# Patient Record
Sex: Female | Born: 1965 | Race: Black or African American | Hispanic: No | Marital: Single | Smoking: Never smoker
Health system: Southern US, Community
[De-identification: ages and names within clinical notes are randomized; demographics above are authoritative.]

## PROBLEM LIST (undated history)

## (undated) DIAGNOSIS — M549 Dorsalgia, unspecified: Secondary | ICD-10-CM

## (undated) DIAGNOSIS — I872 Venous insufficiency (chronic) (peripheral): Secondary | ICD-10-CM

## (undated) DIAGNOSIS — K219 Gastro-esophageal reflux disease without esophagitis: Secondary | ICD-10-CM

## (undated) DIAGNOSIS — M1990 Unspecified osteoarthritis, unspecified site: Secondary | ICD-10-CM

## (undated) DIAGNOSIS — I10 Essential (primary) hypertension: Secondary | ICD-10-CM

## (undated) HISTORY — PX: APPENDECTOMY: SHX54

## (undated) HISTORY — DX: Essential (primary) hypertension: I10

## (undated) HISTORY — PX: ABDOMINAL HYSTERECTOMY: SHX81

---

## 2014-08-30 ENCOUNTER — Encounter (HOSPITAL_COMMUNITY): Payer: Self-pay | Admitting: *Deleted

## 2014-08-30 ENCOUNTER — Emergency Department (HOSPITAL_COMMUNITY): Payer: Self-pay

## 2014-08-30 ENCOUNTER — Emergency Department (HOSPITAL_COMMUNITY)
Admission: EM | Admit: 2014-08-30 | Discharge: 2014-08-30 | Disposition: A | Discharge status: Home / Self Care | Payer: Self-pay | Attending: Emergency Medicine | Admitting: Emergency Medicine

## 2014-08-30 DIAGNOSIS — R079 Chest pain, unspecified: Secondary | ICD-10-CM | POA: Insufficient documentation

## 2014-08-30 DIAGNOSIS — R059 Cough, unspecified: Secondary | ICD-10-CM

## 2014-08-30 DIAGNOSIS — R05 Cough: Secondary | ICD-10-CM | POA: Insufficient documentation

## 2014-08-30 DIAGNOSIS — M1990 Unspecified osteoarthritis, unspecified site: Secondary | ICD-10-CM | POA: Insufficient documentation

## 2014-08-30 DIAGNOSIS — Z8679 Personal history of other diseases of the circulatory system: Secondary | ICD-10-CM | POA: Insufficient documentation

## 2014-08-30 DIAGNOSIS — R0602 Shortness of breath: Secondary | ICD-10-CM | POA: Insufficient documentation

## 2014-08-30 HISTORY — DX: Unspecified osteoarthritis, unspecified site: M19.90

## 2014-08-30 HISTORY — DX: Venous insufficiency (chronic) (peripheral): I87.2

## 2014-08-30 LAB — CBC
HCT: 39.6 % (ref 36.0–46.0)
Hemoglobin: 12.9 g/dL (ref 12.0–15.0)
MCH: 29.5 pg (ref 26.0–34.0)
MCHC: 32.6 g/dL (ref 30.0–36.0)
MCV: 90.6 fL (ref 78.0–100.0)
PLATELETS: 287 10*3/uL (ref 150–400)
RBC: 4.37 MIL/uL (ref 3.87–5.11)
RDW: 14.1 % (ref 11.5–15.5)
WBC: 8.6 10*3/uL (ref 4.0–10.5)

## 2014-08-30 LAB — BASIC METABOLIC PANEL
Anion gap: 8 (ref 5–15)
BUN: 11 mg/dL (ref 6–20)
CO2: 25 mmol/L (ref 22–32)
CREATININE: 0.84 mg/dL (ref 0.44–1.00)
Calcium: 8.6 mg/dL — ABNORMAL LOW (ref 8.9–10.3)
Chloride: 108 mmol/L (ref 101–111)
GFR calc Af Amer: >60 mL/min (ref >60)
Glucose, Bld: 100 mg/dL — ABNORMAL HIGH (ref 65–99)
Potassium: 4.1 mmol/L (ref 3.5–5.1)
Sodium: 141 mmol/L (ref 135–145)

## 2014-08-30 LAB — I-STAT TROPONIN, ED
TROPONIN I, POC: 0 ng/mL (ref 0.00–0.08)
Troponin i, poc: 0 ng/mL (ref 0.00–0.08)

## 2014-08-30 MED ORDER — ALBUTEROL SULFATE (2.5 MG/3ML) 0.083% IN NEBU
2.5000 mg | INHALATION_SOLUTION | Freq: Once | RESPIRATORY_TRACT | Status: AC
  Administered 2014-08-30: 2.5 mg via RESPIRATORY_TRACT
  Filled 2014-08-30: qty 3

## 2014-08-30 MED ORDER — ALBUTEROL SULFATE HFA 108 (90 BASE) MCG/ACT IN AERS
2.0000 | INHALATION_SPRAY | RESPIRATORY_TRACT | Status: DC
  Administered 2014-08-30: 2 via RESPIRATORY_TRACT
  Filled 2014-08-30 (×2): qty 6.7

## 2014-08-30 MED ORDER — BENZONATATE 100 MG PO CAPS
100.0000 mg | ORAL_CAPSULE | Freq: Three times a day (TID) | ORAL | Status: DC
Start: 2014-08-30 — End: 2015-04-08

## 2014-08-30 MED ORDER — BENZONATATE 100 MG PO CAPS
200.0000 mg | ORAL_CAPSULE | Freq: Once | ORAL | Status: AC
  Administered 2014-08-30: 200 mg via ORAL
  Filled 2014-08-30: qty 2

## 2014-08-30 MED ORDER — HYDROCODONE-HOMATROPINE 5-1.5 MG/5ML PO SYRP: 5.0000 mL | ORAL_SOLUTION | Freq: Four times a day (QID) | ORAL | Status: DC | PRN

## 2014-08-30 NOTE — Discharge Instructions (Signed)
Cough, Adult  A cough is a reflex. It helps you clear your throat and airways. A cough can help heal your body. A cough can last 2 or 3 weeks (acute) or may last more than 8 weeks (chronic). Some common causes of a cough can include an infection, allergy, or a cold. HOME CARE  Only take medicine as told by your doctor.  If given, take your medicines (antibiotics) as told. Finish them even if you start to feel better.  Use a cold steam vaporizer or humidifier in your home. This can help loosen thick spit (secretions).  Sleep so you are almost sitting up (semi-upright). Use pillows to do this. This helps reduce coughing.  Rest as needed.  Stop smoking if you smoke. GET HELP RIGHT AWAY IF:  You have yellowish-white fluid (pus) in your thick spit.  Your cough gets worse.  Your medicine does not reduce coughing, and you are losing sleep.  You cough up blood.  You have trouble breathing.  Your pain gets worse and medicine does not help.  You have a fever. MAKE SURE YOU:   Understand these instructions.  Will watch your condition.  Will get help right away if you are not doing well or get worse. Document Released: 11/09/2010 Document Revised: 07/13/2013 Document Reviewed: 11/09/2010 [facility] Patient Information 2015 [place], [place]. This information is not intended to replace advice given to you by your health care provider. Make sure you discuss any questions you have with your health care provider.  Emergency Department Resource Guide 1) Find a Doctor and Pay Out of Pocket Although you won't have to find out who is covered by your insurance plan, it is a good idea to ask around and get recommendations. You will then need to call the office and see if the doctor you have chosen will accept you as a new patient and what types of options they offer for patients who are self-pay. Some doctors offer discounts or will set up payment plans for their patients who do not have insurance,  but you will need to ask so you aren't surprised when you get to your appointment.  2) Contact Your Local Health Department Not all health departments have doctors that can see patients for sick visits, but many do, so it is worth a call to see if yours does. If you don't know where your local health department is, you can check in your phone book. The CDC also has a tool to help you locate your state's health department, and many state websites also have listings of all of their local health departments.  3) Find a [facility] If your illness is not likely to be very severe or complicated, you may want to try a walk in clinic. These are popping up all over the country in pharmacies, drugstores, and shopping centers. They're usually staffed by nurse practitioners or physician assistants that have been trained to treat common illnesses and complaints. They're usually fairly quick and inexpensive. However, if you have serious medical issues or chronic medical problems, these are probably not your best option.  No Primary Care Doctor: - Call Health Connect at  [phone] - they can help you locate a primary care doctor that  accepts your insurance, provides certain services, etc. - Physician Referral Service- [phone]  Chronic Pain Problems: Organization         Address  Phone   Notes  [facility]  [phone] Patients need to be referred by  their primary care doctor.   Medication Assistance: Organization         Address  Phone   Notes  [facility] Medication [facility] [address] [place], [place] [address] [phone] --Must be a resident of [facility] -- Must have NO insurance coverage whatsoever (no Medicaid/ Medicare, etc.) -- The pt. MUST have a primary care doctor that directs their care regularly and follows them in the community   Med[facility]ist  [phone]   [employer]  [phone]    Agencies that provide inexpensive  medical care: Organization         Address  Phone   Notes  [place]  [phone]   [facility]    [phone]   [facility] [place], [place] [address] [phone]   [place] [address] [place] [phone]   Planned Parenthood    [phone]   [facility]    [phone]   [place] and [place] [address], [place] Phone:  [phone], Fax:  [phone] Hours of Operation:  9 am - 6 pm, M-F.  Also accepts Medicaid/Medicare and self-pay.  [facility] for [place] [address], [address], [place] Phone: [phone], Fax: [phone]. Hours of Operation:  8:30 am - 5:30 pm, M-F.  Also accepts Medicaid and self-pay.  [facility] [place] [address] [place] Phone: [phone]   [place], [place], [place] [phone], Ext. [phone] Mondays & Thursdays: 7-9 AM.  First 15 patients are seen on a first come, first serve basis.    [place] Providers:  Organization         Address  Phone   Notes  [facility] [address], [place] [phone] Also accepts self-pay patients.  [facility] [address], [place]  [phone]   [place], [address], [place] [phone]   [facility] [address] [place] [phone]   Lucianne Lei [address], [place]   [phone] Only accepts [place] Access [place] patients after they have their name applied to their card.   Self-Pay (no insurance) in [facility]:  Organization         Address  Phone   Notes  Sickle Cell Patients, [facility] [place] [phone]   [facility]gent Care [place] [phone]   [facility] [place]  [place], [place], [place] [phone]   [facility]/Dr. Osei-Bonsu  [address], [place] or [address], [place] [phone] Phone number for both [place] and [place] locations is the same.  Urgent Medical and [facility] [address] [place] [phone]   [facility] [address] [place] or [address] [phone]   [facility] [address] [place] [phone], phone; [phone], fax Sees patients 1st and 3rd Saturday of every month.  Must not qualify for public or private insurance (i.e. Medicaid, Medicare, [place] Health Choice, Veterans' Benefits)  Household income should be no more than  200% of the poverty level The clinic cannot treat you if you are pregnant or think you are pregnant  Sexually transmitted diseases are not treated at the clinic.    Dental Care: Organization         Address  Phone  Notes  [facility] Department of [facility] [phone] Accepts children up to age 23 who are enrolled in [place] or [place]; pregnant women with a Medicaid card; and children who have applied for Medicaid or [place] Health Choice, but were declined, whose parents can pay a reduced fee at time of service.  [facility] Department of [facility]  [address], [place] [phone] Accepts children up to age 29 who are enrolled in [place] or [place]; pregnant women with a Medicaid card; and children who have applied for Medicaid or [place] Health Choice, but were declined, whose parents can pay a reduced fee at time of service.  [place] Adult Dental Access PROGRAM  [place] [phone] Patients are seen by appointment only. Walk-ins are not accepted.  [place] will see patients 23 years of age and older. Monday - Tuesday (8am-5pm) Most Wednesdays (8:30-5pm) $30 per visit, cash only  [facility] Adult Dental Access PROGRAM  [address], [facility] [phone] Patients are seen by appointment only. Walk-ins are not accepted. [place] will see patients 98 years of age and older. One Wednesday Evening (Monthly: Volunteer Based).  $30 per visit, cash only  [place]  [phone] for adults; Children under age 71, call Graduate Pediatric Dentistry at [phone]. Children aged 2-14, please call [phone] to request a pediatric application.  Dental services are provided in all areas of dental care including fillings, crowns and bridges, complete and partial dentures, implants, gum treatment, root canals, and extractions. Preventive care is also provided. Treatment is provided to both adults and children. Patients are selected via a lottery and there is often a waiting list.   [facility] [address] [place]  [phone] [email or web address]   [facility] [address] [place], [place] [phone], Ext. [phone] Second and Fourth Thursday of each month, opens at 6:30 AM; Clinic ends at 9 AM.  Patients are seen on a first-come first-served basis, and a limited number are seen during each clinic.   [facility]  [address] Hillard Danker [place], [place] [phone]   Eligibility Requirements You must have lived in [place], [place], or [place] counties for at least the last three months.   You cannot be eligible for state or federal sponsored [employer], including [employer], [place], or [employer].   You generally cannot be eligible for healthcare insurance through your employer.    How to apply: Eligibility screenings are held every Tuesday and Wednesday afternoon from 1:00 pm until 4:00 pm. You do not need an appointment for the  interview!  [facility] [address] [place], [place]   [place]  [place] Department  [place]  [phone]    Behavioral Health Resources in the Community: Intensive Outpatient Programs Organization         Address  Phone  Notes  [place]. [address], [place], [place]   [place]  Outpatient [address] [place], [place]   ADS: Alcohol & Drug Svcs [address], [place], [place]   [place] [address],  [place], [place] or [phone]   Substance Abuse Resources Organization         Address  Phone  Notes  Alcohol and Drug Services  [phone]   [place]  [phone]   [facility]   Chinita Pester  [phone]   Residential & Outpatient Substance Abuse Program  [phone]   Psychological Services Organization         Address  Phone  Notes  [facility] [place]  [place]  [phone]   [facility] [address] [place] or [phone]    Mobile Crisis Teams Organization         Address  Phone  Notes  [facility], Mobile Crisis Care Unit  [phone]   [facility]  [address] [place], [place]   [place] [address], [place] [phone]    Self-Help/Support Groups Organization         Address  Phone             Notes  [place]. of [place] - variety of support groups  [place] Call for more information  Narcotics Anonymous (NA), [facility] [address], [employer] [place]  2 meetings at this location   Special educational needs teacher         Address  Phone  Notes  ASAP Residential Treatment  [facility],    [place]  [phone]   [facility]  [address] [place] [address], [place], [place]   [place] Stedman, [place] [phone] Admissions: 8am-3pm M-F  Incentives Substance [place] [address],    [place], [place] [phone]   [facility] [address] [place], [place], [place]   [facility] [address],  [place], [place]   [facility] Programs - Intensive Outpatient [address] Kristeen [address], [place], [place]   [facility] ([place].) [place].,  [place], [place] [phone] or [phone]   [facility]idential Treatment Services (RTS) [address], [place], [place] Accepts Medicaid  [facility] [place] [address],  [place] [phone] Substance Abuse/Addiction Treatment   [facility] Organization         Address  Phone  Notes  [facility]  [phone]   Domenic Schwab, PhD [address] Arlis [place], [place]   [phone] or [phone]   [place], [place] [phone]   [facility] [address], [place], [place] [phone] Insurance/Medicaid/sponsorship through [facility] and Families [address], [address] [place]                                    [place], [place] [phone] [place] [address], [place] [phone]    Dr. Adele Schilder  [phone]   [facility] [place] Dept. 1) [address] [place] 2) [place] 3)  [address]  [place], [place] [phone]  [phone]   [facility] Child Abuse Hotline 934-019-4671 or [phone] (After Hours)

## 2014-08-30 NOTE — ED Notes (Signed)
Pt complains of chest discomfort, wheezing, cough, shortness of breath, nausea progressing over the past several days. Pt states the discomfort and shortness of breath is present when active and resting.

## 2014-08-30 NOTE — ED Provider Notes (Signed)
CSN: [id number]     Arrival date & time 08/30/14  1711 History   First MD Initiated Contact with Patient 08/30/14 1848     Chief Complaint  Patient presents with  . Chest Pain  . Shortness of Breath     (Consider location/radiation/quality/duration/timing/severity/associated sxs/prior Treatment) Patient is a 49 y.o. female presenting with chest pain and shortness of breath. The history is provided by the patient. No language interpreter was used.  Chest Pain Associated symptoms: cough and shortness of breath   Associated symptoms: no dizziness, no fever and no weakness   Shortness of Breath Associated symptoms: chest pain and cough   Associated symptoms: no fever    Miss Peterkin is a 49 year old female with a history of arthritis and chronic venous insufficiency who reports chest pain and shortness of breath for the past 2 days. She states she has been coughing for over 2 weeks. She describes the pain as constant but worse when laying on her side. She states the pain is located in the middle of her chest. She states she took NyQuil twice yesterday with minimal relief. She has not taken aspirin and is not on any anticoagulation. She denies any history of DVT or estrogen use. She denies any fever, chills, dizziness, abdominal pain, vomiting, diarrhea, urinary symptoms, increased leg swelling. She denies any history of hypertension or diabetes. No history of COPD or asthma. She denies smoking. No regular exercise. Past Medical History  Diagnosis Date  . Arthritis   . Chronic venous insufficiency    Past Surgical History  Procedure Laterality Date  . Abdominal hysterectomy    . Appendectomy     No family history on file. History  Substance Use Topics  . Smoking status: Never Smoker   . Smokeless tobacco: Not on file  . Alcohol Use: No   OB History    No data available     Review of Systems  Constitutional: Negative for fever.  Respiratory: Positive for cough and shortness of  breath.   Cardiovascular: Positive for chest pain.  Neurological: Negative for dizziness, syncope and weakness.  All other systems reviewed and are negative.     Allergies  Review of patient's allergies indicates no known allergies.  Home Medications   Prior to Admission medications   Medication Sig Start Date End Date Taking? Authorizing Provider  Doxylamine-DM (VICKS NYQUIL COUGH) 6.25-15 MG/15ML LIQD Take 15 mLs by mouth every 8 (eight) hours as needed (cough/congestion).   Yes Historical Provider, MD  ibuprofen (ADVIL,MOTRIN) 800 MG tablet Take 800 mg by mouth every 8 (eight) hours as needed for moderate pain.   Yes Historical Provider, MD  indomethacin (INDOCIN SR) 75 MG CR capsule Take 75 mg by mouth daily with breakfast.   Yes Historical Provider, MD  benzonatate (TESSALON) 100 MG capsule Take 1 capsule (100 mg total) by mouth every 8 (eight) hours. 08/30/14   Jentry Warnell Patel-Mills, PA-C  HYDROcodone-homatropine (HYCODAN) 5-1.5 MG/5ML syrup Take 5 mLs by mouth every 6 (six) hours as needed for cough. 08/30/14   Talissa Apple Patel-Mills, PA-C   BP 145/83 mmHg  Pulse 84  Temp(Src) 98.2 F (36.8 C) (Oral)  Resp 20  SpO2 99% Physical Exam  Constitutional: She is oriented to person, place, and time. Vital signs are normal. She appears well-developed and well-nourished.  Obese.  HENT:  Head: Normocephalic and atraumatic.  Eyes: Conjunctivae are normal.  Neck: Normal range of motion. Neck supple.  Cardiovascular: Normal rate, regular rhythm and normal heart sounds.  Pulmonary/Chest: Effort normal and breath sounds normal. No respiratory distress. She has no wheezes. She has no rales. She exhibits no tenderness.  Abdominal: Soft. There is no tenderness.  Musculoskeletal: Normal range of motion.  Neurological: She is alert and oriented to person, place, and time.  Skin: Skin is warm and dry.  Nursing note and vitals reviewed.   ED Course  Procedures (including critical care time) Labs  Review Labs Reviewed  BASIC METABOLIC PANEL - Abnormal; Notable for the following:    Glucose, Bld 100 (*)    Calcium 8.6 (*)    All other components within normal limits  CBC  I-STAT TROPOININ, ED  I-STAT TROPOININ, ED    Imaging Review Dg Chest 2 View (if Patient Has Fever And/or Copd)  08/30/2014   CLINICAL DATA:  Chest discomfort. Wheezing. Cough. Shortness of breath.  EXAM: CHEST  2 VIEW  COMPARISON:  None.  FINDINGS: Lateral view degraded by patient arm position. Mild thoracic spondylosis. Midline trachea. Normal heart size and mediastinal contours. No pleural effusion or pneumothorax. Clear lungs.  IMPRESSION: No acute cardiopulmonary disease.   Electronically Signed   By: Abigail Miyamoto M.D.   On: 08/30/2014 18:36     EKG Interpretation   Date/Time:  Monday August 30 2014 17:24:10 EDT Ventricular Rate:  112 PR Interval:  145 QRS Duration: 75 QT Interval:  315 QTC Calculation: 430 R Axis:   -13 Text Interpretation:  Sinus tachycardia Atrial premature complex Low  voltage, precordial leads LVH by voltage Anterior Q waves, possibly due to  LVH No previous tracing Confirmed by POLLINA  MD, CHRISTOPHER [phone]) on  08/30/2014 8:05:39 PM      MDM   Final diagnoses:  Shortness of breath  Cough  Patient presents for chest pain and shortness of breath that is worse with exertion and when laying down. She states she has had a nonproductive cough for several weeks which is causing her chest to hurt. She is in no acute distress and 100% oxygen on room air. Her labs are unremarkable, troponin negative, EKG shows tachycardia but otherwise is not concerning. On exam she has a normal rate and rhythm. I reviewed the heart score and she is at low risk for major cardiac event. Her chest x-ray shows no pneumothorax, pneumonia, edema. I have given her a nebulizer treatment. Repeat troponin was negative also. Her heart rate has since come down. I believe this is caused by an upper respiratory  infection and not her heart or a PE. I prescribed her hycodan and tessalon for cough. I explained that hycodan may be expensive and if she could not afford it, she could have the tessalon filled.  I also gave her an inhaler. I gave her return precautions such as increased shortness of breath or fever.     Ottie Glazier, PA-C 08/31/14 [place], MD 09/06/14 [phone]

## 2015-02-20 ENCOUNTER — Encounter (HOSPITAL_COMMUNITY): Payer: Self-pay

## 2015-02-20 ENCOUNTER — Emergency Department (HOSPITAL_COMMUNITY)
Admission: EM | Admit: 2015-02-20 | Discharge: 2015-02-20 | Disposition: A | Discharge status: Home / Self Care | Payer: Self-pay | Attending: Emergency Medicine | Admitting: Emergency Medicine

## 2015-02-20 DIAGNOSIS — M1990 Unspecified osteoarthritis, unspecified site: Secondary | ICD-10-CM | POA: Insufficient documentation

## 2015-02-20 DIAGNOSIS — R110 Nausea: Secondary | ICD-10-CM | POA: Insufficient documentation

## 2015-02-20 DIAGNOSIS — M545 Low back pain, unspecified: Secondary | ICD-10-CM

## 2015-02-20 DIAGNOSIS — Z8679 Personal history of other diseases of the circulatory system: Secondary | ICD-10-CM | POA: Insufficient documentation

## 2015-02-20 DIAGNOSIS — M62830 Muscle spasm of back: Secondary | ICD-10-CM | POA: Insufficient documentation

## 2015-02-20 LAB — URINALYSIS, ROUTINE W REFLEX MICROSCOPIC
BILIRUBIN URINE: NEGATIVE
GLUCOSE, UA: NEGATIVE mg/dL
Ketones, ur: NEGATIVE mg/dL
Leukocytes, UA: NEGATIVE
NITRITE: NEGATIVE
Protein, ur: NEGATIVE mg/dL
Specific Gravity, Urine: 1.018 (ref 1.005–1.030)
pH: 6 (ref 5.0–8.0)

## 2015-02-20 LAB — URINE MICROSCOPIC-ADD ON
RBC / HPF: NONE SEEN RBC/hpf (ref 0–5)
WBC, UA: NONE SEEN WBC/hpf (ref 0–5)

## 2015-02-20 MED ORDER — TRAMADOL HCL 50 MG PO TABS: 50.0000 mg | ORAL_TABLET | Freq: Two times a day (BID) | ORAL | Status: DC | PRN

## 2015-02-20 MED ORDER — PROMETHAZINE HCL 25 MG PO TABS: 25.0000 mg | ORAL_TABLET | Freq: Four times a day (QID) | ORAL | Status: DC | PRN

## 2015-02-20 MED ORDER — IBUPROFEN 800 MG PO TABS: 800.0000 mg | ORAL_TABLET | Freq: Three times a day (TID) | ORAL | Status: DC | PRN

## 2015-02-20 MED ORDER — CYCLOBENZAPRINE HCL 10 MG PO TABS: 10.0000 mg | ORAL_TABLET | Freq: Three times a day (TID) | ORAL | Status: DC | PRN

## 2015-02-20 NOTE — ED Notes (Signed)
She c/o right-sided low back pain and chronic nausea.  She is in no distress.

## 2015-02-20 NOTE — ED Provider Notes (Signed)
CSN: [id number]     Arrival date & time 02/20/15  1423 History   First MD Initiated Contact with Patient 02/20/15 1506     Chief Complaint  Patient presents with  . Back Pain     (Consider location/radiation/quality/duration/timing/severity/associated sxs/prior Treatment) HPI Comments: Laura Huber is a 49 y.o. female with a PMHx of chronic venous insufficiency and arthritis with a PSHx of appendectomy and abd hysterectomy, who presents to the ED with complaints of R low back pain x2-3 weeks and chronic nausea. Patient states she has had 8/10 intermittent achy right lower back pain which is nonradiating, worse with laying on her right side or twisting in certain positions, and unrelieved by home ibuprofen. She states she has had similar symptoms in the past. She has chronic arthritis. She also endorses chronic nausea which she states is typically after eating. She recently moved here from [place], no longer has her tramadol prescription.  She denies any fevers, chills, chest pain, shortness breath, abdominal pain, vomiting, diarrhea, constipation, obstipation, melena, hematochezia, dysuria, hematuria, flank pain, vaginal bleeding or discharge, numbness, tingling, weakness, incontinence of urine or stool, cauda equina symptoms, or recent trauma/heavy lifting. She does not currently have a PCP.  Patient is a 49 y.o. female presenting with back pain. The history is provided by the patient. No language interpreter was used.  Back Pain Location:  Lumbar spine Quality:  Aching Radiates to:  Does not radiate Pain severity:  Moderate Pain is:  Same all the time Onset quality:  Gradual Duration:  2 weeks Timing:  Intermittent Progression:  Unchanged Chronicity:  Recurrent Context: not lifting heavy objects and not recent injury   Relieved by:  Nothing Worsened by:  Twisting, movement and lying down Ineffective treatments:  Ibuprofen Associated symptoms: no abdominal pain, no bladder  incontinence, no bowel incontinence, no chest pain, no dysuria, no fever, no leg pain, no numbness, no paresthesias, no perianal numbness, no tingling and no weakness   Risk factors: obesity     Past Medical History  Diagnosis Date  . Arthritis   . Chronic venous insufficiency    Past Surgical History  Procedure Laterality Date  . Abdominal hysterectomy    . Appendectomy     No family history on file. Social History  Substance Use Topics  . Smoking status: Never Smoker   . Smokeless tobacco: None  . Alcohol Use: No   OB History    No data available     Review of Systems  Constitutional: Negative for fever and chills.  Respiratory: Negative for shortness of breath.   Cardiovascular: Negative for chest pain.  Gastrointestinal: Positive for nausea. Negative for vomiting, abdominal pain, diarrhea, constipation, blood in stool and bowel incontinence.  Genitourinary: Negative for bladder incontinence, dysuria, hematuria, flank pain, vaginal bleeding and vaginal discharge.  Musculoskeletal: Positive for back pain. Negative for myalgias and arthralgias.  Skin: Negative for color change.  Allergic/Immunologic: Negative for immunocompromised state.  Neurological: Negative for tingling, weakness, numbness and paresthesias.  Psychiatric/Behavioral: Negative for confusion.   10 Systems reviewed and are negative for acute change except as noted in the HPI.    Allergies  Review of patient's allergies indicates no known allergies.  Home Medications   Prior to Admission medications   Medication Sig Start Date End Date Taking? Authorizing Provider  benzonatate (TESSALON) 100 MG capsule Take 1 capsule (100 mg total) by mouth every 8 (eight) hours. 08/30/14   Hanna Patel-Mills, PA-C  HYDROcodone-homatropine (HYCODAN) 5-1.5 MG/5ML syrup Take  5 mLs by mouth every 6 (six) hours as needed for cough. 08/30/14   Hanna Patel-Mills, PA-C  ibuprofen (ADVIL,MOTRIN) 800 MG tablet Take 800 mg by mouth  every 8 (eight) hours as needed for moderate pain.    Historical Provider, MD   BP 142/79 mmHg  Pulse 101  Temp(Src) 98 F (36.7 C) (Oral)  Resp 20  SpO2 100% Physical Exam  Constitutional: She is oriented to person, place, and time. Vital signs are normal. She appears well-developed and well-nourished.  Non-toxic appearance. No distress.  Afebrile, nontoxic, NAD  HENT:  Head: Normocephalic and atraumatic.  Mouth/Throat: Oropharynx is clear and moist and mucous membranes are normal.  Eyes: Conjunctivae and EOM are normal. Right eye exhibits no discharge. Left eye exhibits no discharge.  Neck: Normal range of motion. Neck supple.  Cardiovascular: Normal rate, regular rhythm, normal heart sounds and intact distal pulses.  Exam reveals no gallop and no friction rub.   No murmur heard. Tachycardic in triage which resolved on exam  Pulmonary/Chest: Effort normal and breath sounds normal. No respiratory distress. She has no decreased breath sounds. She has no wheezes. She has no rhonchi. She has no rales.  Abdominal: Soft. Normal appearance and bowel sounds are normal. She exhibits no distension. There is no tenderness. There is no rigidity, no rebound, no guarding, no CVA tenderness, no tenderness at McBurney's point and negative Murphy's sign.  Soft, NTND, +BS throughout, no r/g/r, neg murphy's, neg mcburney's, no CVA TTP   Musculoskeletal: Normal range of motion.       Lumbar back: She exhibits tenderness and spasm. She exhibits normal range of motion, no bony tenderness and no deformity.       Back:  Lumbar spine with FROM intact without spinous process TTP, no bony stepoffs or deformities, with mild R sided paraspinous muscle TTP and palpable muscle spasm. Strength 5/5 in all extremities, sensation grossly intact in all extremities, negative SLR bilaterally, gait steady and nonantalgic. No overlying skin changes. Distal pulses intact  Neurological: She is alert and oriented to person,  place, and time. She has normal strength. No sensory deficit.  Skin: Skin is warm, dry and intact. No rash noted.  Psychiatric: She has a normal mood and affect.  Nursing note and vitals reviewed.   ED Course  Procedures (including critical care time) Labs Review Labs Reviewed  URINALYSIS, ROUTINE W REFLEX MICROSCOPIC (NOT AT [facility]) - Abnormal; Notable for the following:    APPearance HAZY (*)    Hgb urine dipstick TRACE (*)    All other components within normal limits  URINE MICROSCOPIC-ADD ON - Abnormal; Notable for the following:    Squamous Epithelial / LPF 6-30 (*)    Bacteria, UA FEW (*)    All other components within normal limits    Imaging Review No results found. I have personally reviewed and evaluated these images and lab results as part of my medical decision-making.   EKG Interpretation None      MDM   Final diagnoses:  Right-sided low back pain without sciatica  Back muscle spasm  Nausea    49 y.o. female here with R low back pain x2 wks. Hx of similar symptoms. No red flag s/s of low back pain. No s/s of central cord compression or cauda equina. Lower extremities are neurovascularly intact and patient is ambulating without difficulty. U/A contaminated but no evidence of infection or RBCs, no urinary symptoms, doubt stone or pyelo.  Patient was counseled on back pain  precautions and told to do activity as tolerated but do not lift, push, or pull heavy objects more than 10 pounds for the next week. Patient counseled to use ice or heat on back for no longer than 15 minutes every hour.   Rx given for muscle relaxer and counseled on proper use of muscle relaxant medication. Rx given for narcotic pain medicine and counseled on proper use of narcotic pain medications. Told that they can increase to every 4 hrs if needed while pain is worse. Counseled not to combine this medication with others containing tylenol. Urged patient not to drink alcohol, drive, or perform  any other activities that requires focus while taking either of these medications. Rx for ibuprofen given. Also given phenergan for nausea. Discussed use of zantac while taking NSAIDs which is likely the cause of her nausea.   Patient urged to follow-up with [facility] in 1wk to establish care and if pain does not improve with treatment and rest or if pain becomes recurrent. Urged to return with worsening severe pain, loss of bowel or bladder control, trouble walking. The patient verbalizes understanding and agrees with the plan.  BP 142/79 mmHg  Pulse 101  Temp(Src) 98 F (36.7 C) (Oral)  Resp 20  SpO2 100%  Meds ordered this encounter  Medications  . ibuprofen (ADVIL,MOTRIN) 800 MG tablet    Sig: Take 1 tablet (800 mg total) by mouth every 8 (eight) hours as needed for mild pain, moderate pain or cramping.    Dispense:  21 tablet    Refill:  0    Order Specific Question:  Supervising Provider    Answer:  MILLER, BRIAN [[id number]]  . traMADol (ULTRAM) 50 MG tablet    Sig: Take 1 tablet (50 mg total) by mouth every 12 (twelve) hours as needed for severe pain.    Dispense:  16 tablet    Refill:  0    Order Specific Question:  Supervising Provider    Answer:  MILLER, BRIAN [[id number]]  . cyclobenzaprine (FLEXERIL) 10 MG tablet    Sig: Take 1 tablet (10 mg total) by mouth 3 (three) times daily as needed for muscle spasms.    Dispense:  15 tablet    Refill:  0    Order Specific Question:  Supervising Provider    Answer:  MILLER, BRIAN [[id number]]  . promethazine (PHENERGAN) 25 MG tablet    Sig: Take 1 tablet (25 mg total) by mouth every 6 (six) hours as needed for nausea or vomiting.    Dispense:  10 tablet    Refill:  0    Order Specific Question:  Supervising Provider    Answer:  Barnett Hatter, PA-C 02/20/15 [place], MD 02/21/15 [phone]

## 2015-02-20 NOTE — Discharge Instructions (Signed)
Nausea: use phenergan as directed. Stay well hydrated. Use zantac while taking anti-inflammatories like ibuprofen to protect the lining of your stomach.  Back Pain: Your back pain should be treated with medicines such as ibuprofen or aleve and this back pain should get better over the next 2 weeks.  However if you develop severe or worsening pain, low back pain with fever, numbness, weakness or inability to walk or urinate, you should return to the ER immediately.  Please follow up with your doctor this week for a recheck if still having symptoms.  Avoid heavy lifting over 10 pounds over the next two weeks.  Low back pain is discomfort in the lower back that may be due to injuries to muscles and ligaments around the spine.  Occasionally, it may be caused by a a problem to a part of the spine called a disc.  The pain may last several days or a week;  However, most patients get completely well in 4 weeks.  Self - care:  The application of heat can help soothe the pain.  Maintaining your daily activities, including walking, is encourged, as it will help you get better faster than just staying in bed. Perform gentle stretching as discussed. Drink plenty of fluids.  Medications are also useful to help with pain control.  A commonly prescribed medication includes tramadol.  Do not drive or operate heavy machinery while taking this medication.  Non steroidal anti inflammatory medications including Ibuprofen and naproxen;  These medications help both pain and swelling and are very useful in treating back pain.  They should be taken with food, as they can cause stomach upset, and more seriously, stomach bleeding.  Take zantac while taking these in order to help protect the lining of your stomach.  Muscle relaxants:  These medications can help with muscle tightness that is a cause of lower back pain.  Most of these medications can cause drowsiness, and it is not safe to drive or use dangerous machinery while  taking them.  SEEK IMMEDIATE MEDICAL ATTENTION IF: New numbness, tingling, weakness, or problem with the use of your arms or legs.  Severe back pain not relieved with medications.  Difficulty with or loss of control of your bowel or bladder control.  Increasing pain in any areas of the body (such as chest or abdominal pain).  Shortness of breath, dizziness or fainting.  Nausea (feeling sick to your stomach), vomiting, fever, or sweats.  You will need to follow up with  [facility] in 1-2 weeks for reassessment and to establish medical care.   Back Pain, Adult Back pain is very common in adults.The cause of back pain is rarely dangerous and the pain often gets better over time.The cause of your back pain may not be known. Some common causes of back pain include:  Strain of the muscles or ligaments supporting the spine.  Wear and tear (degeneration) of the spinal disks.  Arthritis.  Direct injury to the back. For many people, back pain may return. Since back pain is rarely dangerous, most people can learn to manage this condition on their own. HOME CARE INSTRUCTIONS Watch your back pain for any changes. The following actions may help to lessen any discomfort you are feeling:  Remain active. It is stressful on your back to sit or stand in one place for long periods of time. Do not sit, drive, or stand in one place for more than 30 minutes at a time. Take short walks on  even surfaces as soon as you are able.Try to increase the length of time you walk each day.  Exercise regularly as directed by your health care provider. Exercise helps your back heal faster. It also helps avoid future injury by keeping your muscles strong and flexible.  Do not stay in bed.Resting more than 1-2 days can delay your recovery.  Pay attention to your body when you bend and lift. The most comfortable positions are those that put less stress on your recovering back. Always use proper lifting  techniques, including:  Bending your knees.  Keeping the load close to your body.  Avoiding twisting.  Find a comfortable position to sleep. Use a firm mattress and lie on your side with your knees slightly bent. If you lie on your back, put a pillow under your knees.  Avoid feeling anxious or stressed.Stress increases muscle tension and can worsen back pain.It is important to recognize when you are anxious or stressed and learn ways to manage it, such as with exercise.  Take medicines only as directed by your health care provider. Over-the-counter medicines to reduce pain and inflammation are often the most helpful.Your health care provider may prescribe muscle relaxant drugs.These medicines help dull your pain so you can more quickly return to your normal activities and healthy exercise.  Apply ice to the injured area:  Put ice in a plastic bag.  Place a towel between your skin and the bag.  Leave the ice on for 20 minutes, 2-3 times a day for the first 2-3 days. After that, ice and heat may be alternated to reduce pain and spasms.  Maintain a healthy weight. Excess weight puts extra stress on your back and makes it difficult to maintain good posture. SEEK MEDICAL CARE IF:  You have pain that is not relieved with rest or medicine.  You have increasing pain going down into the legs or buttocks.  You have pain that does not improve in one week.  You have night pain.  You lose weight.  You have a fever or chills. SEEK IMMEDIATE MEDICAL CARE IF:   You develop new bowel or bladder control problems.  You have unusual weakness or numbness in your arms or legs.  You develop nausea or vomiting.  You develop abdominal pain.  You feel faint.   This information is not intended to replace advice given to you by your health care provider. Make sure you discuss any questions you have with your health care provider.   Document Released: 02/26/2005 Document Revised: 03/19/2014  Document Reviewed: 06/30/2013 Elsevier Interactive Patient Education 2016 [place].  Back Exercises If you have pain in your back, do these exercises 2-3 times each day or as told by your doctor. When the pain goes away, do the exercises once each day, but repeat the steps more times for each exercise (do more repetitions). If you do not have pain in your back, do these exercises once each day or as told by your doctor. EXERCISES Single Knee to Chest Do these steps 3-5 times in a row for each leg:  Lie on your back on a firm bed or the floor with your legs stretched out.  Bring one knee to your chest.  Hold your knee to your chest by grabbing your knee or thigh.  Pull on your knee until you feel a gentle stretch in your lower back.  Keep doing the stretch for 10-30 seconds.  Slowly let go of your leg and straighten it. Pelvic  Tilt Do these steps 5-10 times in a row:  Lie on your back on a firm bed or the floor with your legs stretched out.  Bend your knees so they point up to the ceiling. Your feet should be flat on the floor.  Tighten your lower belly (abdomen) muscles to press your lower back against the floor. This will make your tailbone point up to the ceiling instead of pointing down to your feet or the floor.  Stay in this position for 5-10 seconds while you gently tighten your muscles and breathe evenly. Cat-Cow Do these steps until your lower back bends more easily:  Get on your hands and knees on a firm surface. Keep your hands under your shoulders, and keep your knees under your hips. You may put padding under your knees.  Let your head hang down, and make your tailbone point down to the floor so your lower back is round like the back of a cat.  Stay in this position for 5 seconds.  Slowly lift your head and make your tailbone point up to the ceiling so your back hangs low (sags) like the back of a cow.  Stay in this position for 5 seconds. Press-Ups Do  these steps 5-10 times in a row:  Lie on your belly (face-down) on the floor.  Place your hands near your head, about shoulder-width apart.  While you keep your back relaxed and keep your hips on the floor, slowly straighten your arms to raise the top half of your body and lift your shoulders. Do not use your back muscles. To make yourself more comfortable, you may change where you place your hands.  Stay in this position for 5 seconds.  Slowly return to lying flat on the floor. Bridges Do these steps 10 times in a row:  Lie on your back on a firm surface.  Bend your knees so they point up to the ceiling. Your feet should be flat on the floor.  Tighten your butt muscles and lift your butt off of the floor until your waist is almost as high as your knees. If you do not feel the muscles working in your butt and the back of your thighs, slide your feet 1-2 inches farther away from your butt.  Stay in this position for 3-5 seconds.  Slowly lower your butt to the floor, and let your butt muscles relax. If this exercise is too easy, try doing it with your arms crossed over your chest. Belly Crunches Do these steps 5-10 times in a row:  Lie on your back on a firm bed or the floor with your legs stretched out.  Bend your knees so they point up to the ceiling. Your feet should be flat on the floor.  Cross your arms over your chest.  Tip your chin a little bit toward your chest but do not bend your neck.  Tighten your belly muscles and slowly raise your chest just enough to lift your shoulder blades a tiny bit off of the floor.  Slowly lower your chest and your head to the floor. Back Lifts Do these steps 5-10 times in a row: 1. Lie on your belly (face-down) with your arms at your sides, and rest your forehead on the floor. 2. Tighten the muscles in your legs and your butt. 3. Slowly lift your chest off of the floor while you keep your hips on the floor. Keep the back of your head in  line with the curve  in your back. Look at the floor while you do this. 4. Stay in this position for 3-5 seconds. 5. Slowly lower your chest and your face to the floor. GET HELP IF:  Your back pain gets a lot worse when you do an exercise.  Your back pain does not lessen 2 hours after you exercise. If you have any of these problems, stop doing the exercises. Do not do them again unless your doctor says it is okay. GET HELP RIGHT AWAY IF:  You have sudden, very bad back pain. If this happens, stop doing the exercises. Do not do them again unless your doctor says it is okay.   This information is not intended to replace advice given to you by your health care provider. Make sure you discuss any questions you have with your health care provider.   Document Released: 03/31/2010 Document Revised: 11/17/2014 Document Reviewed: 04/22/2014 Elsevier Interactive Patient Education 2016 [place] Injury Prevention Back injuries can be very painful. They can also be difficult to heal. After having one back injury, you are more likely to injure your back again. It is important to learn how to avoid injuring or re-injuring your back. The following tips can help you to prevent a back injury. WHAT SHOULD I KNOW ABOUT PHYSICAL FITNESS?  Exercise for 30 minutes per day on most days of the week or as told by your doctor. Make sure to:  Do aerobic exercises, such as walking, jogging, biking, or swimming.  Do exercises that increase balance and strength, such as tai chi and yoga.  Do stretching exercises. This helps with flexibility.  Try to develop strong belly (abdominal) muscles. Your belly muscles help to support your back.  Stay at a healthy weight. This helps to decrease your risk of a back injury. WHAT SHOULD I KNOW ABOUT MY DIET?  Talk with your doctor about your overall diet. Take supplements and vitamins only as told by your doctor.  Talk with your doctor about how much calcium and  vitamin D you need each day. These nutrients help to prevent weakening of the bones (osteoporosis).  Include good sources of calcium in your diet, such as:  Dairy products.  Green leafy vegetables.  Products that have had calcium added to them (fortified).  Include good sources of vitamin D in your diet, such as:  Milk.  Foods that have had vitamin D added to them. WHAT SHOULD I KNOW ABOUT MY POSTURE?  Sit up straight and stand up straight. Avoid leaning forward when you sit or hunching over when you stand.  Choose chairs that have good low-back (lumbar) support.  If you work at a desk, sit close to it so you do not need to lean over. Keep your chin tucked in. Keep your neck drawn back. Keep your elbows bent so your arms look like the letter "L" (right angle).  Sit high and close to the steering wheel when you drive. Add a low-back support to your car seat, if needed.  Avoid sitting or standing in one position for very long. Take breaks to get up, stretch, and walk around at least one time every hour. Take breaks every hour if you are driving for long periods of time.  Sleep on your side with your knees slightly bent, or sleep on your back with a pillow under your knees. Do not lie on the front of your body to sleep. WHAT SHOULD I KNOW ABOUT LIFTING, TWISTING, AND REACHING Lifting and Heavy  Lifting  Avoid heavy lifting, especially lifting over and over again. If you must do heavy lifting:  Stretch before lifting.  Work slowly.  Rest between lifts.  Use a tool such as a cart or a dolly to move objects if one is available.  Make several small trips instead of carrying one heavy load.  Ask for help when you need it, especially when moving big objects.  Follow these steps when lifting:  Stand with your feet shoulder-width apart.  Get as close to the object as you can. Do not pick up a heavy object that is far from your body.  Use handles or lifting straps if they are  available.  Bend at your knees. Squat down, but keep your heels off the floor.  Keep your shoulders back. Keep your chin tucked in. Keep your back straight.  Lift the object slowly while you tighten the muscles in your legs, belly, and butt. Keep the object as close to the center of your body as possible.  Follow these steps when putting down a heavy load:  Stand with your feet shoulder-width apart.  Lower the object slowly while you tighten the muscles in your legs, belly, and butt. Keep the object as close to the center of your body as possible.  Keep your shoulders back. Keep your chin tucked in. Keep your back straight.  Bend at your knees. Squat down, but keep your heels off the floor.  Use handles or lifting straps if they are available. Twisting and Reaching  Avoid lifting heavy objects above your waist.  Do not twist at your waist while you are lifting or carrying a load. If you need to turn, move your feet.  Do not bend over without bending at your knees.  Avoid reaching over your head, across a table, or for an object on a high surface.  WHAT ARE SOME OTHER TIPS?  Avoid wet floors and icy ground. Keep sidewalks clear of ice to prevent falls.   Do not sleep on a mattress that is too soft or too hard.   Keep items that you use often within easy reach.   Put heavier objects on shelves at waist level, and put lighter objects on lower or higher shelves.  Find ways to lower your stress, such as:  Exercise.  Massage.  Relaxation techniques.  Talk with your doctor if you feel anxious or depressed. These conditions can make back pain worse.  Wear flat heel shoes with cushioned soles.  Avoid making quick (sudden) movements.  Use both shoulder straps when carrying a backpack.  Do not use any tobacco products, including cigarettes, chewing tobacco, or electronic cigarettes. If you need help quitting, ask your doctor.   This information is not intended to  replace advice given to you by your health care provider. Make sure you discuss any questions you have with your health care provider.   Document Released: 08/15/2007 Document Revised: 07/13/2014 Document Reviewed: 03/02/2014 Elsevier Interactive Patient Education 2016 [place] therapy can help ease sore, stiff, injured, and tight muscles and joints. Heat relaxes your muscles, which may help ease your pain. Heat therapy should only be used on old, pre-existing, or long-lasting (chronic) injuries. Do not use heat therapy unless told by your doctor. HOW TO USE HEAT THERAPY There are several different kinds of heat therapy, including:  Moist heat pack.  Warm water bath.  Hot water bottle.  Electric heating pad.  Heated gel pack.  Heated wrap.  Electric heating pad. GENERAL HEAT THERAPY RECOMMENDATIONS   Do not sleep while using heat therapy. Only use heat therapy while you are awake.  Your skin may turn pink while using heat therapy. Do not use heat therapy if your skin turns red.  Do not use heat therapy if you have new pain.  High heat or long exposure to heat can cause burns. Be careful when using heat therapy to avoid burning your skin.  Do not use heat therapy on areas of your skin that are already irritated, such as with a rash or sunburn. GET HELP IF:   You have blisters, redness, swelling (puffiness), or numbness.  You have new pain.  Your pain is worse. MAKE SURE YOU:  Understand these instructions.  Will watch your condition.  Will get help right away if you are not doing well or get worse.   This information is not intended to replace advice given to you by your health care provider. Make sure you discuss any questions you have with your health care provider.   Document Released: 05/21/2011 Document Revised: 03/19/2014 Document Reviewed: 04/21/2013 Elsevier Interactive Patient Education 2016 [employer]  Muscle Cramps and  Spasms Muscle cramps and spasms are when muscles tighten by themselves. They usually get better within minutes. Muscle cramps are painful. They are usually stronger and last longer than muscle spasms. Muscle spasms may or may not be painful. They can last a few seconds or much longer. HOME CARE  Drink enough fluid to keep your pee (urine) clear or pale yellow.  Massage, stretch, and relax the muscle.  Use a warm towel, heating pad, or warm shower water on tight muscles.  Place ice on the muscle if it is tender or in pain.  Put ice in a plastic bag.  Place a towel between your skin and the bag.  Leave the ice on for 15-20 minutes, 03-04 times a day.  Only take medicine as told by your doctor. GET HELP RIGHT AWAY IF:  Your cramps or spasms get worse, happen more often, or do not get better with time. MAKE SURE YOU:  Understand these instructions.  Will watch your condition.  Will get help right away if you are not doing well or get worse.   This information is not intended to replace advice given to you by your health care provider. Make sure you discuss any questions you have with your health care provider.   Document Released: 02/09/2008 Document Revised: 06/23/2012 Document Reviewed: 02/13/2012 Elsevier Interactive Patient Education 2016 [employer] Inc[employer]  Nausea, Adult Nausea is the feeling that you have an upset stomach or have to vomit. Nausea by itself is not likely a serious concern, but it may be an early sign of more serious medical problems. As nausea gets worse, it can lead to vomiting. If vomiting develops, there is the risk of dehydration.  CAUSES   Viral infections.  Food poisoning.  Medicines.  Pregnancy.  Motion sickness.  Migraine headaches.  Emotional distress.  Severe pain from any source.  Alcohol intoxication. HOME CARE INSTRUCTIONS  Get plenty of rest.  Ask your caregiver about specific rehydration instructions.  Eat small amounts of food  and sip liquids more often.  Take all medicines as told by your caregiver. SEEK MEDICAL CARE IF:  You have not improved after 2 days, or you get worse.  You have a headache. SEEK IMMEDIATE MEDICAL CARE IF:   You have a fever.  You faint.  You keep vomiting or  have blood in your vomit.  You are extremely weak or dehydrated.  You have dark or bloody stools.  You have severe chest or abdominal pain. MAKE SURE YOU:  Understand these instructions.  Will watch your condition.  Will get help right away if you are not doing well or get worse.   This information is not intended to replace advice given to you by your health care provider. Make sure you discuss any questions you have with your health care provider.   Document Released: 04/05/2004 Document Revised: 03/19/2014 Document Reviewed: 11/08/2010 Elsevier Interactive Patient Education [employer].

## 2015-04-05 ENCOUNTER — Emergency Department (HOSPITAL_COMMUNITY)
Admission: EM | Admit: 2015-04-05 | Discharge: 2015-04-05 | Disposition: A | Discharge status: Home / Self Care | Payer: Self-pay | Attending: Emergency Medicine | Admitting: Emergency Medicine

## 2015-04-05 ENCOUNTER — Encounter (HOSPITAL_COMMUNITY): Payer: Self-pay

## 2015-04-05 DIAGNOSIS — M1990 Unspecified osteoarthritis, unspecified site: Secondary | ICD-10-CM | POA: Insufficient documentation

## 2015-04-05 DIAGNOSIS — M549 Dorsalgia, unspecified: Secondary | ICD-10-CM | POA: Insufficient documentation

## 2015-04-05 DIAGNOSIS — Z79899 Other long term (current) drug therapy: Secondary | ICD-10-CM | POA: Insufficient documentation

## 2015-04-05 DIAGNOSIS — Z791 Long term (current) use of non-steroidal anti-inflammatories (NSAID): Secondary | ICD-10-CM | POA: Insufficient documentation

## 2015-04-05 DIAGNOSIS — G8929 Other chronic pain: Secondary | ICD-10-CM | POA: Insufficient documentation

## 2015-04-05 DIAGNOSIS — Z8679 Personal history of other diseases of the circulatory system: Secondary | ICD-10-CM | POA: Insufficient documentation

## 2015-04-05 HISTORY — DX: Dorsalgia, unspecified: M54.9

## 2015-04-05 MED ORDER — DIAZEPAM 5 MG PO TABS
5.0000 mg | ORAL_TABLET | Freq: Once | ORAL | Status: AC
  Administered 2015-04-05: 5 mg via ORAL
  Filled 2015-04-05: qty 1

## 2015-04-05 MED ORDER — NAPROXEN 500 MG PO TABS: 500.0000 mg | ORAL_TABLET | Freq: Two times a day (BID) | ORAL | Status: DC

## 2015-04-05 MED ORDER — KETOROLAC TROMETHAMINE 60 MG/2ML IM SOLN
60.0000 mg | Freq: Once | INTRAMUSCULAR | Status: AC
  Administered 2015-04-05: 60 mg via INTRAMUSCULAR
  Filled 2015-04-05: qty 2

## 2015-04-05 NOTE — Discharge Instructions (Signed)
It is important for you to follow-up with your doctor or the [facility] in order to establish primary care. Please take your medications as prescribed. Return to ED for any new or worsening symptoms.  Back Pain, Adult Back pain is very common in adults.The cause of back pain is rarely dangerous and the pain often gets better over time.The cause of your back pain may not be known. Some common causes of back pain include:  Strain of the muscles or ligaments supporting the spine.  Wear and tear (degeneration) of the spinal disks.  Arthritis.  Direct injury to the back. For many people, back pain may return. Since back pain is rarely dangerous, most people can learn to manage this condition on their own. HOME CARE INSTRUCTIONS Watch your back pain for any changes. The following actions may help to lessen any discomfort you are feeling:  Remain active. It is stressful on your back to sit or stand in one place for long periods of time. Do not sit, drive, or stand in one place for more than 30 minutes at a time. Take short walks on even surfaces as soon as you are able.Try to increase the length of time you walk each day.  Exercise regularly as directed by your health care provider. Exercise helps your back heal faster. It also helps avoid future injury by keeping your muscles strong and flexible.  Do not stay in bed.Resting more than 1-2 days can delay your recovery.  Pay attention to your body when you bend and lift. The most comfortable positions are those that put less stress on your recovering back. Always use proper lifting techniques, including:  Bending your knees.  Keeping the load close to your body.  Avoiding twisting.  Find a comfortable position to sleep. Use a firm mattress and lie on your side with your knees slightly bent. If you lie on your back, put a pillow under your knees.  Avoid feeling anxious or stressed.Stress increases muscle tension  and can worsen back pain.It is important to recognize when you are anxious or stressed and learn ways to manage it, such as with exercise.  Take medicines only as directed by your health care provider. Over-the-counter medicines to reduce pain and inflammation are often the most helpful.Your health care provider may prescribe muscle relaxant drugs.These medicines help dull your pain so you can more quickly return to your normal activities and healthy exercise.  Apply ice to the injured area:  Put ice in a plastic bag.  Place a towel between your skin and the bag.  Leave the ice on for 20 minutes, 2-3 times a day for the first 2-3 days. After that, ice and heat may be alternated to reduce pain and spasms.  Maintain a healthy weight. Excess weight puts extra stress on your back and makes it difficult to maintain good posture. SEEK MEDICAL CARE IF:  You have pain that is not relieved with rest or medicine.  You have increasing pain going down into the legs or buttocks.  You have pain that does not improve in one week.  You have night pain.  You lose weight.  You have a fever or chills. SEEK IMMEDIATE MEDICAL CARE IF:   You develop new bowel or bladder control problems.  You have unusual weakness or numbness in your arms or legs.  You develop nausea or vomiting.  You develop abdominal pain.  You feel faint.   This information is not intended to replace  advice given to you by your health care provider. Make sure you discuss any questions you have with your health care provider.   Document Released: 02/26/2005 Document Revised: 03/19/2014 Document Reviewed: 06/30/2013 Elsevier Interactive Patient Education [employer].

## 2015-04-05 NOTE — ED Notes (Signed)
Patient reports a history of back pain, but has had increased right lower back pain that radiates into the right leg. Patient states she took Advil with no relief and old Percocet with no relief.

## 2015-04-05 NOTE — ED Provider Notes (Signed)
History  By signing my name below, I, Laura Huber, attest that this documentation has been prepared under the direction and in the presence of [employer], PA-C. Electronically Signed: Marlowe Huber, ED Scribe. 04/05/2015. 2:36 PM.  Chief Complaint  Patient presents with  . Back Pain   The history is provided by the patient and medical records. No language interpreter was used.    HPI Comments:  Laura Huber is a 50 y.o. morbidly obese female with PMHx of back pain who presents to the Emergency Department complaining of worsening lower right-sided back pain that radiates down her RLE into her right knee and into the foot that began about three days ago. She reports pain on the left side of the back as well. She states she always has problems with her back but states it has worsened since starting a new job. She describes the pain as burning and grinding. She denies modifying factors. She has taken Percocet, Indomethacin, Advil, Flexeril, Tramadol and DayPro with no relief of the pain. She denies saddle anesthesia, numbness, tingling or weakness of any extremity, fever, chills, nausea or vomiting, IVDU. She denies h/o cancer. She denies trauma, injury or fall. Pt does not have a PCP.  Past Medical History  Diagnosis Date  . Arthritis   . Chronic venous insufficiency   . Back pain    Past Surgical History  Procedure Laterality Date  . Abdominal hysterectomy    . Appendectomy     Family History  Problem Relation Age of Onset  . Hypertension Mother   . Hypertension Father    Social History  Substance Use Topics  . Smoking status: Never Smoker   . Smokeless tobacco: Never Used  . Alcohol Use: No   OB History    No data available     Review of Systems A complete 10 system review of systems was obtained and all systems are negative except as noted in the HPI and PMH.   Allergies  Review of patient's allergies indicates no known allergies.  Home Medications   Prior  to Admission medications   Medication Sig Start Date End Date Taking? Authorizing Provider  benzonatate (TESSALON) 100 MG capsule Take 1 capsule (100 mg total) by mouth every 8 (eight) hours. Patient not taking: Reported on 02/20/2015 08/30/14   Ottie Glazier, PA-C  cyclobenzaprine (FLEXERIL) 10 MG tablet Take 1 tablet (10 mg total) by mouth 3 (three) times daily as needed for muscle spasms. 02/20/15   Mercedes Camprubi-Soms, PA-C  HYDROcodone-homatropine (HYCODAN) 5-1.5 MG/5ML syrup Take 5 mLs by mouth every 6 (six) hours as needed for cough. Patient not taking: Reported on 02/20/2015 08/30/14   Ottie Glazier, PA-C  ibuprofen (ADVIL,MOTRIN) 800 MG tablet Take 800 mg by mouth every 8 (eight) hours as needed for moderate pain.     Historical Provider, MD  ibuprofen (ADVIL,MOTRIN) 800 MG tablet Take 1 tablet (800 mg total) by mouth every 8 (eight) hours as needed for mild pain, moderate pain or cramping. 02/20/15   Mercedes Camprubi-Soms, PA-C  Ibuprofen 200 MG CAPS Take 400 mg by mouth 2 (two) times daily.    Historical Provider, MD  naproxen (NAPROSYN) 500 MG tablet Take 1 tablet (500 mg total) by mouth 2 (two) times daily. 04/05/15   Comer Locket, PA-C  OVER THE COUNTER MEDICATION 30 mLs 2 (two) times daily as needed (For cough and cold symptoms). [employer] equate brand- liquid for cough and cold symptoms    Historical Provider, MD  promethazine (PHENERGAN) 25  MG tablet Take 1 tablet (25 mg total) by mouth every 6 (six) hours as needed for nausea or vomiting. 02/20/15   Mercedes Camprubi-Soms, PA-C  traMADol (ULTRAM) 50 MG tablet Take 1 tablet (50 mg total) by mouth every 12 (twelve) hours as needed for severe pain. 02/20/15   Mercedes Camprubi-Soms, PA-C  TRAMADOL HCL PO Take 50 mg by mouth 2 (two) times daily.    Historical Provider, MD   Triage Vitals: BP 154/94 mmHg  Pulse 78  Temp(Src) 98 F (36.7 C) (Oral)  Resp 17  SpO2 100% Physical Exam  Constitutional: She is oriented to  person, place, and time. She appears well-developed and well-nourished.  Morbidly obese  HENT:  Head: Normocephalic and atraumatic.  Eyes: EOM are normal.  Neck: Normal range of motion.  Cardiovascular: Normal rate.   Pulmonary/Chest: Effort normal.  Musculoskeletal: Normal range of motion. She exhibits tenderness.  Tenderness diffusely throughout entire paraspinous musculature. No obvious muscle spasm appreciated. No tenderness of spinous processes. Full active range of motion of the spine.  Neurological: She is alert and oriented to person, place, and time.  Motor strength and sensation appear baseline. Moves all extremities without ataxia. Gait is somewhat antalgic but no ataxia.  Skin: Skin is warm and dry.  Psychiatric: She has a normal mood and affect. Her behavior is normal.  Nursing note and vitals reviewed.   ED Course  Procedures (including critical care time) DIAGNOSTIC STUDIES: Oxygen Saturation is 100% on RA, normal by my interpretation.   COORDINATION OF CARE: 2:33 PM- Will order pain medication prior to discharge and will give resource to follow up and establish care with a PCP. Will provide work note per pt request. Pt verbalizes understanding and agrees to plan.  Medications  ketorolac (TORADOL) injection 60 mg (60 mg Intramuscular Given 04/05/15 1440)  diazepam (VALIUM) tablet 5 mg (5 mg Oral Given 04/05/15 1439)    MDM   Final diagnoses:  Bilateral back pain, unspecified location    Patient with back pain.  Seems to be a chronic and ongoing problem for her. No neurological deficits and normal neuro exam.  Patient is ambulatory.  No loss of bowel or bladder control.  No concern for cauda equina.  No fever, night sweats, weight loss, h/o cancer, IVDA, no recent procedure to back. No urinary symptoms suggestive of UTI.  Supportive care and return precaution discussed. Appears safe for discharge at this time. Follow up as indicated in discharge paperwork.  Patient  was seen in December for similar back pain and given [facility]. Patient admits she did not follow-up. Will provide referral again today and urged to follow-up for continuity of care.  I personally performed the services described in this documentation, which was scribed in my presence. The recorded information has been reviewed and is accurate.     Comer Locket, PA-C 04/05/15 [place], MD 04/07/15 [phone]

## 2015-04-08 ENCOUNTER — Ambulatory Visit: Payer: Self-pay | Attending: Internal Medicine | Admitting: Physician Assistant

## 2015-04-08 ENCOUNTER — Encounter (HOSPITAL_BASED_OUTPATIENT_CLINIC_OR_DEPARTMENT_OTHER): Payer: Self-pay | Admitting: Clinical

## 2015-04-08 VITALS — BP 137/81 | HR 64 | Temp 97.8°F | Resp 18 | Ht 59.0 in | Wt 308.0 lb

## 2015-04-08 DIAGNOSIS — M546 Pain in thoracic spine: Secondary | ICD-10-CM

## 2015-04-08 DIAGNOSIS — F4321 Adjustment disorder with depressed mood: Secondary | ICD-10-CM

## 2015-04-08 DIAGNOSIS — M549 Dorsalgia, unspecified: Secondary | ICD-10-CM | POA: Insufficient documentation

## 2015-04-08 MED ORDER — TRAMADOL HCL 50 MG PO TABS: 50.0000 mg | ORAL_TABLET | Freq: Two times a day (BID) | ORAL | Status: DC | PRN

## 2015-04-08 MED ORDER — CYCLOBENZAPRINE HCL 10 MG PO TABS: 10.0000 mg | ORAL_TABLET | Freq: Three times a day (TID) | ORAL | Status: DC | PRN

## 2015-04-08 NOTE — Progress Notes (Signed)
Patient went to [facility] ED on Tuesday for severe pain on right side, mid-back, down right leg. Pain was also across shoulders. ED gave patient shot of toradol and valium. Patient reports current pain level, in above areas, at level 8, described as aching, "feels like someone is in my bones scraping", lower right back/side are burns. Patient reports right knee is sore and tender. Patient has been taking a lot of advil. Today patient took 3 advil liquigels 200mg  at 9am and at 12pm she took 4 more. Patient has arthritis in both knees and spine.

## 2015-04-08 NOTE — Progress Notes (Signed)
ASSESSMENT: Pt currently experiencing adjustment disorder with depressed mood, needs to f/u with PCP and [facility]; would benefit from psychoeducation and brief therapeutic interventions to cope with symptoms of depression. Stage of Change: contemplative  PLAN: 1. F/U with behavioral health consultant in one month 2. Psychiatric Medications: none. 3. Behavioral recommendation(s):   -Consider self care as important to wellbeing -Read educational material regarding coping with symptoms of depression -Consider [employer] as community resource/social support classes  SUBJECTIVE: Pt. referred by Zettie Pho for symptoms of depression:  Pt. 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 Thought processes normal and appropriate to situation. Mood: teary. Affect: appropriate Appearance: appropriate Risk of harm to self or others: no known risk of harm to self or others Substance use: none Assessments administered: PHQ9: 15/ GAD7: 5  Diagnosis: Adjustment disorder with depressed mood CPT Code: F43.21 -------------------------------------------- Other(s) present in the room: grown daughter  Time spent with patient in exam room: 20 minutes, 3:10pm -3:30pm

## 2015-04-08 NOTE — Progress Notes (Signed)
Laura Huber  [id number]  [id number]  DOB - Oct 28, 1965  Chief Complaint  Patient presents with  . Back Pain       Subjective:   Laura Huber is a 50 y.o. female here today for establishment of care. She was in the emergency department on January 24th. She presented with right low back pain with radiation into the right lower extremity right knee and right foot. 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 Her symptoms became exacerbated at that time. She has a presented to the hospital in December of last year with similar complaints and has had physical therapy in the past as well. She's been taking Advil over-the-counter with temporary relief. She was treated with Toradol and Valium in the emergency department. No imaging studies were done. She was given a prescription for Naprosyn but she states that she did not fill it because it hurts her stomach.  She has been able to buy a new pair of shoes ([employer]) which has helped her the most in regards to her symptomatology.  ROS: GEN: denies fever or chills, denies change in weight LUNGS: denies SHOB, dyspnea, PND, orthopnea CV: denies CP or palpitations EXT: denies muscle spasms or swelling; no pain in lower ext, no weakness NEURO: denies numbness or tingling, denies sz, stroke or TIA  Problem  Back Pain    ALLERGIES: No Known Allergies  PAST MEDICAL HISTORY: Past Medical History  Diagnosis Date  . Arthritis   . Chronic venous insufficiency   . Back pain     PAST SURGICAL HISTORY: Past Surgical History  Procedure Laterality Date  . Abdominal hysterectomy    . Appendectomy      MEDICATIONS AT HOME: Prior to Admission medications   Medication Sig Start Date End Date Taking? Authorizing Provider  Ibuprofen 200 MG CAPS Take 400 mg by mouth 2 (two) times daily.   Yes Historical Provider, MD  promethazine (PHENERGAN) 25 MG tablet Take 1 tablet (25 mg total) by mouth every 6 (six)  hours as needed for nausea or vomiting. 02/20/15  Yes Mercedes Camprubi-Soms, PA-C  cyclobenzaprine (FLEXERIL) 10 MG tablet Take 1 tablet (10 mg total) by mouth 3 (three) times daily as needed for muscle spasms. 04/08/15   Raheem Kolbe Daneil Dan, PA-C  naproxen (NAPROSYN) 500 MG tablet Take 1 tablet (500 mg total) by mouth 2 (two) times daily. Patient not taking: Reported on 04/08/2015 04/05/15   Comer Locket, PA-C  traMADol (ULTRAM) 50 MG tablet Take 1 tablet (50 mg total) by mouth every 12 (twelve) hours as needed for severe pain. 04/08/15   Kiyan Burmester Daneil Dan, PA-C  TRAMADOL HCL PO Take 50 mg by mouth 2 (two) times daily. Reported on 04/08/2015    Historical Provider, MD     Objective:   Filed Vitals:   04/08/15 1518 04/08/15 1529  BP:  137/81  Pulse:  64  Temp:  97.8 F (36.6 C)  TempSrc:  Oral  Resp:  18  Height: 4\' 11"  (1.499 m)   Weight: 308 lb (139.708 kg)   SpO2:  99%    Exam General appearance : Awake, alert, not in any distress. Speech Clear. Not toxic looking HEENT: Atraumatic and Normocephalic, pupils equally reactive to light and accomodation.  Abdomen: Bowel sounds present, Non tender and not distended with no gaurding, rigidity or rebound. Extremities: B/L Lower Ext shows no edema, both legs are warm to touch; decrease ROM and tight upper back Neurology: Awake alert, and  oriented X 3, CN II-XII intact, Non focal    Assessment & Plan  1. Acute on chronic low back pain  -Flexeril/tramadol  -Weight loss encouraged  -Back strengthening stretches and exercises discussed 2. Morbid obesity  -Weight loss encouraged   -Increase activity as tolerated   Return in about 4 weeks (around 05/06/2015). for routine health maintenance  The patient was given clear instructions to go to ER or return to medical center if symptoms don't improve, worsen or new problems develop. The patient verbalized understanding. The patient was told to call to get lab results if they haven't heard anything  in the next week.   This note has been created with Surveyor, quantity. Any transcriptional errors are unintentional.    Zettie Pho, PA-C [facility], [place]   04/08/2015, 3:54 PM

## 2015-05-05 ENCOUNTER — Ambulatory Visit: Payer: Self-pay | Admitting: Internal Medicine

## 2015-05-09 ENCOUNTER — Ambulatory Visit: Payer: Self-pay | Admitting: Internal Medicine

## 2015-05-16 ENCOUNTER — Encounter: Payer: Self-pay | Admitting: Internal Medicine

## 2015-05-16 ENCOUNTER — Ambulatory Visit: Payer: Self-pay | Attending: Internal Medicine | Admitting: Internal Medicine

## 2015-05-16 VITALS — BP 146/82 | HR 83 | Temp 98.0°F | Resp 16 | Ht 59.0 in | Wt 304.0 lb

## 2015-05-16 DIAGNOSIS — I872 Venous insufficiency (chronic) (peripheral): Secondary | ICD-10-CM | POA: Insufficient documentation

## 2015-05-16 DIAGNOSIS — G8929 Other chronic pain: Secondary | ICD-10-CM | POA: Insufficient documentation

## 2015-05-16 DIAGNOSIS — M170 Bilateral primary osteoarthritis of knee: Secondary | ICD-10-CM | POA: Insufficient documentation

## 2015-05-16 DIAGNOSIS — Z79899 Other long term (current) drug therapy: Secondary | ICD-10-CM | POA: Insufficient documentation

## 2015-05-16 DIAGNOSIS — M174 Other bilateral secondary osteoarthritis of knee: Secondary | ICD-10-CM

## 2015-05-16 DIAGNOSIS — M549 Dorsalgia, unspecified: Secondary | ICD-10-CM | POA: Insufficient documentation

## 2015-05-16 MED ORDER — CYCLOBENZAPRINE HCL 10 MG PO TABS: 10.0000 mg | ORAL_TABLET | Freq: Three times a day (TID) | ORAL | Status: DC | PRN

## 2015-05-16 MED ORDER — TRAMADOL HCL 50 MG PO TABS: 50.0000 mg | ORAL_TABLET | Freq: Two times a day (BID) | ORAL | Status: DC | PRN

## 2015-05-16 NOTE — Progress Notes (Signed)
Patient ID: Laura Huber, female   DOB: [birth year], 50 y.o.   MRN: [id number]  CC: knee/back pain  HPI: Laura Huber is a 50 y.o. female here today for a follow up visit.  Patient has past medical history of arthritis and morbid obesity. Patient has been evaluated here 5 weeks ago with symptoms of right low back pain with radiation into the right lower extremity, right knee, and right foot. 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 Her symptoms became exacerbated at that time. She presented to the hospital in December of last year with similar complaints and has had physical therapy in the past as well. She's been taking Advil over-the-counter with temporary relief. She was treated with Toradol and Valium in the emergency department. No imaging studies were done. She was given a prescription for Naprosyn but she states that she did not fill it because it hurts her stomach. She denies bowel and bladder dysfunction. She states that she had imaging of knee a few years ago that showed osteoarthritis of knees. She reports some "back problems" and has been to physical therapy.   No Known Allergies Past Medical History  Diagnosis Date  . Arthritis   . Chronic venous insufficiency   . Back pain    Current Outpatient Prescriptions on File Prior to Visit  Medication Sig Dispense Refill  . cyclobenzaprine (FLEXERIL) 10 MG tablet Take 1 tablet (10 mg total) by mouth 3 (three) times daily as needed for muscle spasms. 30 tablet 0  . Ibuprofen 200 MG CAPS Take 400 mg by mouth 2 (two) times daily.    . naproxen (NAPROSYN) 500 MG tablet Take 1 tablet (500 mg total) by mouth 2 (two) times daily. (Patient not taking: Reported on 04/08/2015) 30 tablet 0  . promethazine (PHENERGAN) 25 MG tablet Take 1 tablet (25 mg total) by mouth every 6 (six) hours as needed for nausea or vomiting. 10 tablet 0  . traMADol (ULTRAM) 50 MG tablet Take 1 tablet (50 mg total) by mouth every 12 (twelve)  hours as needed for severe pain. 60 tablet 0  . TRAMADOL HCL PO Take 50 mg by mouth 2 (two) times daily. Reported on 04/08/2015     No current facility-administered medications on file prior to visit.   Family History  Problem Relation Age of Onset  . Hypertension Mother   . Hypertension Father    Social History   Social History  . Marital Status: Single    Spouse Name: N/A  . Number of Children: N/A  . Years of Education: N/A   Occupational History  . Not on file.   Social History Main Topics  . Smoking status: Never Smoker   . Smokeless tobacco: Never Used  . Alcohol Use: No  . Drug Use: No  . Sexual Activity: Not on file   Other Topics Concern  . Not on file   Social History Narrative    Review of Systems: Other than what is stated in HPI, all other systems are negative.   Objective:   Filed Vitals:   05/16/15 1458  BP: 146/82  Pulse: 83  Temp: 98 F (36.7 C)  Resp: 16    Physical Exam  Constitutional: She is oriented to person, place, and time.  Morbidly obese  Cardiovascular: Normal rate, regular rhythm and normal heart sounds.   Pulmonary/Chest: Effort normal and breath sounds normal.  Musculoskeletal: Normal range of motion. She exhibits no edema or tenderness.  Neurological:  She is alert and oriented to person, place, and time.  Skin: Skin is warm and dry.     Lab Results  Component Value Date   WBC 8.6 08/30/2014   HGB 12.9 08/30/2014   HCT 39.6 08/30/2014   MCV 90.6 08/30/2014   PLT 287 08/30/2014   Lab Results  Component Value Date   CREATININE 0.84 08/30/2014   BUN 11 08/30/2014   NA 141 08/30/2014   K 4.1 08/30/2014   CL 108 08/30/2014   CO2 25 08/30/2014    No results found for: HGBA1C Lipid Panel  No results found for: CHOL, TRIG, HDL, CHOLHDL, VLDL, LDLCALC     Assessment and plan:   Baxter Flattery was seen today for follow-up.  Diagnoses and all orders for this visit:  Other secondary osteoarthritis of both knees -      traMADol (ULTRAM) 50 MG tablet; Take 1 tablet (50 mg total) by mouth every 12 (twelve) hours as needed for severe pain. -     cyclobenzaprine (FLEXERIL) 10 MG tablet; Take 1 tablet (10 mg total) by mouth 3 (three) times daily as needed for muscle spasms. Patient would be a good candidate for joint injections but she is uninsured. She will apply for [facility] discount and a referral will be placed.   Chronic back pain See above  Morbid obesity, unspecified obesity type ([place]) Stressed weight relating to her chronic knee and back pain. She has a BMI of over 61. I have went over changes she can make slowly to help lose at least 10 pounds over the next month. Discussed pool therapy as well to help with knee pain.   Return if symptoms worsen or fail to improve.       Lance Bosch, [facility] [phone] 05/16/2015, 3:07 PM

## 2015-05-16 NOTE — Patient Instructions (Signed)

## 2015-05-16 NOTE — Progress Notes (Signed)
Patient here for follow up on her knee and back pain Today complains of having cold symptoms for the past four days Cough and congestion

## 2015-10-22 ENCOUNTER — Encounter (HOSPITAL_COMMUNITY): Payer: Self-pay | Admitting: Emergency Medicine

## 2015-10-22 ENCOUNTER — Emergency Department (HOSPITAL_COMMUNITY)
Admission: EM | Admit: 2015-10-22 | Discharge: 2015-10-22 | Disposition: A | Discharge status: Home / Self Care | Payer: Self-pay | Attending: Emergency Medicine | Admitting: Emergency Medicine

## 2015-10-22 DIAGNOSIS — M549 Dorsalgia, unspecified: Secondary | ICD-10-CM

## 2015-10-22 DIAGNOSIS — G8929 Other chronic pain: Secondary | ICD-10-CM

## 2015-10-22 DIAGNOSIS — M25562 Pain in left knee: Secondary | ICD-10-CM | POA: Insufficient documentation

## 2015-10-22 DIAGNOSIS — Z791 Long term (current) use of non-steroidal anti-inflammatories (NSAID): Secondary | ICD-10-CM | POA: Insufficient documentation

## 2015-10-22 DIAGNOSIS — M25561 Pain in right knee: Secondary | ICD-10-CM

## 2015-10-22 MED ORDER — METHOCARBAMOL 500 MG PO TABS: 500.0000 mg | ORAL_TABLET | Freq: Two times a day (BID) | ORAL | 0 refills | Status: DC

## 2015-10-22 MED ORDER — TRAMADOL HCL 50 MG PO TABS: 50.0000 mg | ORAL_TABLET | Freq: Four times a day (QID) | ORAL | 0 refills | Status: DC | PRN

## 2015-10-22 MED ORDER — KETOROLAC TROMETHAMINE 60 MG/2ML IM SOLN
60.0000 mg | Freq: Once | INTRAMUSCULAR | Status: AC
  Administered 2015-10-22: 60 mg via INTRAMUSCULAR
  Filled 2015-10-22: qty 2

## 2015-10-22 NOTE — ED Provider Notes (Signed)
[facility] DEPT Provider Note   CSN: [id number] Arrival date & time: 10/22/15  1726  First Provider Contact:   First MD Initiated Contact with Patient 10/22/15 1809       By signing my name below, I, Shanna Cisco, attest that this documentation has been prepared under the direction and in the presence of Devon Kingdon, PA-C. Electronically signed by: Shanna Cisco, ED Scribe. 10/22/15. 6:56 PM.    History   Chief Complaint Chief Complaint  Patient presents with  . Back Pain  . Leg Pain     The history is provided by the patient. No language interpreter was used.   HPI Comments:  Laura Huber is a 50 y.o. female who presents to the Emergency Department complaining of chronic, gradually worsening back and bilateral knee pain, which started years ago. Pain is described as spastic and is worse in right knee. Pt reports that pain is there all the time, but she has been "having a flare" for the last 3 days. She occasionally goes to the community health center and is treated with Tramadol and Flexeril, which provides temporary relief but she has been out of this medication. Pt has tried Ibuprofen and heating pads at home, which do not help. Denies inability to walk, abnormal numbness or tingling in extremities, or bladder and bowel dysfunction. No previous history of renal problems. Pt has never seen pain management.    Past Medical History:  Diagnosis Date  . Arthritis   . Back pain   . Chronic venous insufficiency     Patient Active Problem List   Diagnosis Date Noted  . Back pain 04/08/2015    Past Surgical History:  Procedure Laterality Date  . ABDOMINAL HYSTERECTOMY    . APPENDECTOMY      OB History    No data available       Home Medications    Prior to Admission medications   Medication Sig Start Date End Date Taking? Authorizing Provider  cyclobenzaprine (FLEXERIL) 10 MG tablet Take 1 tablet (10 mg total) by mouth 3 (three) times daily as needed for  muscle spasms. 05/16/15   Lance Bosch, NP  Ibuprofen 200 MG CAPS Take 400 mg by mouth 2 (two) times daily.    Historical Provider, MD  traMADol (ULTRAM) 50 MG tablet Take 1 tablet (50 mg total) by mouth every 12 (twelve) hours as needed for severe pain. 05/16/15   Lance Bosch, NP  TRAMADOL HCL PO Take 50 mg by mouth 2 (two) times daily. Reported on 04/08/2015    Historical Provider, MD    Family History Family History  Problem Relation Age of Onset  . Hypertension Mother   . Hypertension Father     Social History Social History  Substance Use Topics  . Smoking status: Never Smoker  . Smokeless tobacco: Never Used  . Alcohol use No     Allergies   Review of patient's allergies indicates no known allergies.   Review of Systems Review of Systems  All other systems reviewed and are negative.    Physical Exam Updated Vital Signs BP 128/93 (BP Location: Left Arm)   Pulse 92   Temp 97.8 F (36.6 C) (Oral)   Resp 18   SpO2 100%   Physical Exam  Constitutional: She is oriented to person, place, and time. She appears well-developed and well-nourished. No distress.  HENT:  Head: Normocephalic and atraumatic.  Eyes: Conjunctivae are normal. Right eye exhibits no discharge. Left eye exhibits no  discharge. No scleral icterus.  Neck: Normal range of motion. Neck supple.  Cardiovascular: Normal rate.   Pulmonary/Chest: Effort normal.  Musculoskeletal:  TTP of midline thoracic and lumbar spine as well as bilateral cervical, thoracic, and lumbar  paraspinal muscles. FROM of C, T, L spine. No step offs or obvious bony deformities. Negative SLR.    Neurological: She is alert and oriented to person, place, and time. Coordination normal.  Strength 5/5 throughout. No sensory deficits. No gait abnormality.  Skin: Skin is warm and dry. No rash noted. She is not diaphoretic. No erythema. No pallor.  Psychiatric: She has a normal mood and affect. Her behavior is normal.  Nursing note  and vitals reviewed.    ED Treatments / Results  DIAGNOSTIC STUDIES:  Oxygen Saturation is 100% on room air, normal by my interpretation.    COORDINATION OF CARE:  6:12 PM Discussed treatment plan with pt at bedside, which include injection of Toradol and referral to pain management,  and pt agreed to plan.  Labs (all labs ordered are listed, but only abnormal results are displayed) Labs Reviewed - No data to display  EKG  EKG Interpretation None       Radiology No results found.  Procedures Procedures (including critical care time)  Medications Ordered in ED Medications - No data to display   Initial Impression / Assessment and Plan / ED Course  I have reviewed the triage vital signs and the nursing notes.  Pertinent labs & imaging results that were available during my care of the patient were reviewed by me and considered in my medical decision making (see chart for details).  Clinical Course    Patient with exacerbation of chronic back pain. Upon review of pt records, pt has been seen multiple times in the ED as well as at [facility] for similar symptoms. Pt states her pain is typical of her chronic pain. No neurological deficits and normal neuro exam.  Patient can walk but states is painful.  No loss of bowel or bladder control.  No concern for cauda equina.  No fever, night sweats, weight loss, h/o cancer, IVDU. Discussed with pt that she will likely need pain management for proper management of her symptoms. Toradol shot given in ED. No hx of renal dysfunction. Rx for short course tramadol and robaxin given. RICE protocol and pain medicine indicated and discussed with patient. Pt will follow up with PCP and pain clinic as needed.    Final Clinical Impressions(s) / ED Diagnoses   Final diagnoses:  Chronic back pain  Arthralgia of both knees    New Prescriptions New Prescriptions   No medications on file  I personally performed  the services described in this documentation, which was scribed in my presence. The recorded information has been reviewed and is accurate.      Dondra Spry [place], PA-C 10/22/15 1908    Duffy Bruce, MD 10/23/15 [phone]

## 2015-10-22 NOTE — Discharge Instructions (Signed)
Apply ice to affected area. Take tramadol and robaxin as needed for pain. Follow up with pain management for further pain care. Return to the ED if you experience severe worsening of your symptoms, loss of control of bowel and bladder function, numbness or tingling in both lower extremities, fevers, inability to walk.

## 2015-10-22 NOTE — ED Triage Notes (Signed)
Patient c/o back pain that radiates down her right leg that is there all the timer but worse today per patient. Patient also c/o left knee pain,. Patient states that pain so bad she had to leave work today.

## 2016-05-08 ENCOUNTER — Ambulatory Visit
Admission: RE | Admit: 2016-05-08 | Discharge: 2016-05-08 | Disposition: A | Discharge status: Home / Self Care | Payer: Self-pay | Source: Ambulatory Visit | Attending: Family Medicine | Admitting: Family Medicine

## 2016-05-08 ENCOUNTER — Other Ambulatory Visit: Payer: Self-pay | Admitting: Family Medicine

## 2016-05-08 DIAGNOSIS — M170 Bilateral primary osteoarthritis of knee: Secondary | ICD-10-CM

## 2016-07-06 ENCOUNTER — Other Ambulatory Visit: Payer: Self-pay | Admitting: Family Medicine

## 2016-07-06 DIAGNOSIS — Z1231 Encounter for screening mammogram for malignant neoplasm of breast: Secondary | ICD-10-CM

## 2016-07-11 ENCOUNTER — Encounter: Payer: Self-pay | Admitting: Gastroenterology

## 2016-07-26 IMAGING — CR DG CHEST 2V
2 series · 2 of 2 positions shown · non-contrast
Comparison: None.

CLINICAL DATA: Chest discomfort. Wheezing. Cough. Shortness of
breath.

EXAM:
CHEST  2 VIEW

[w chest pa]
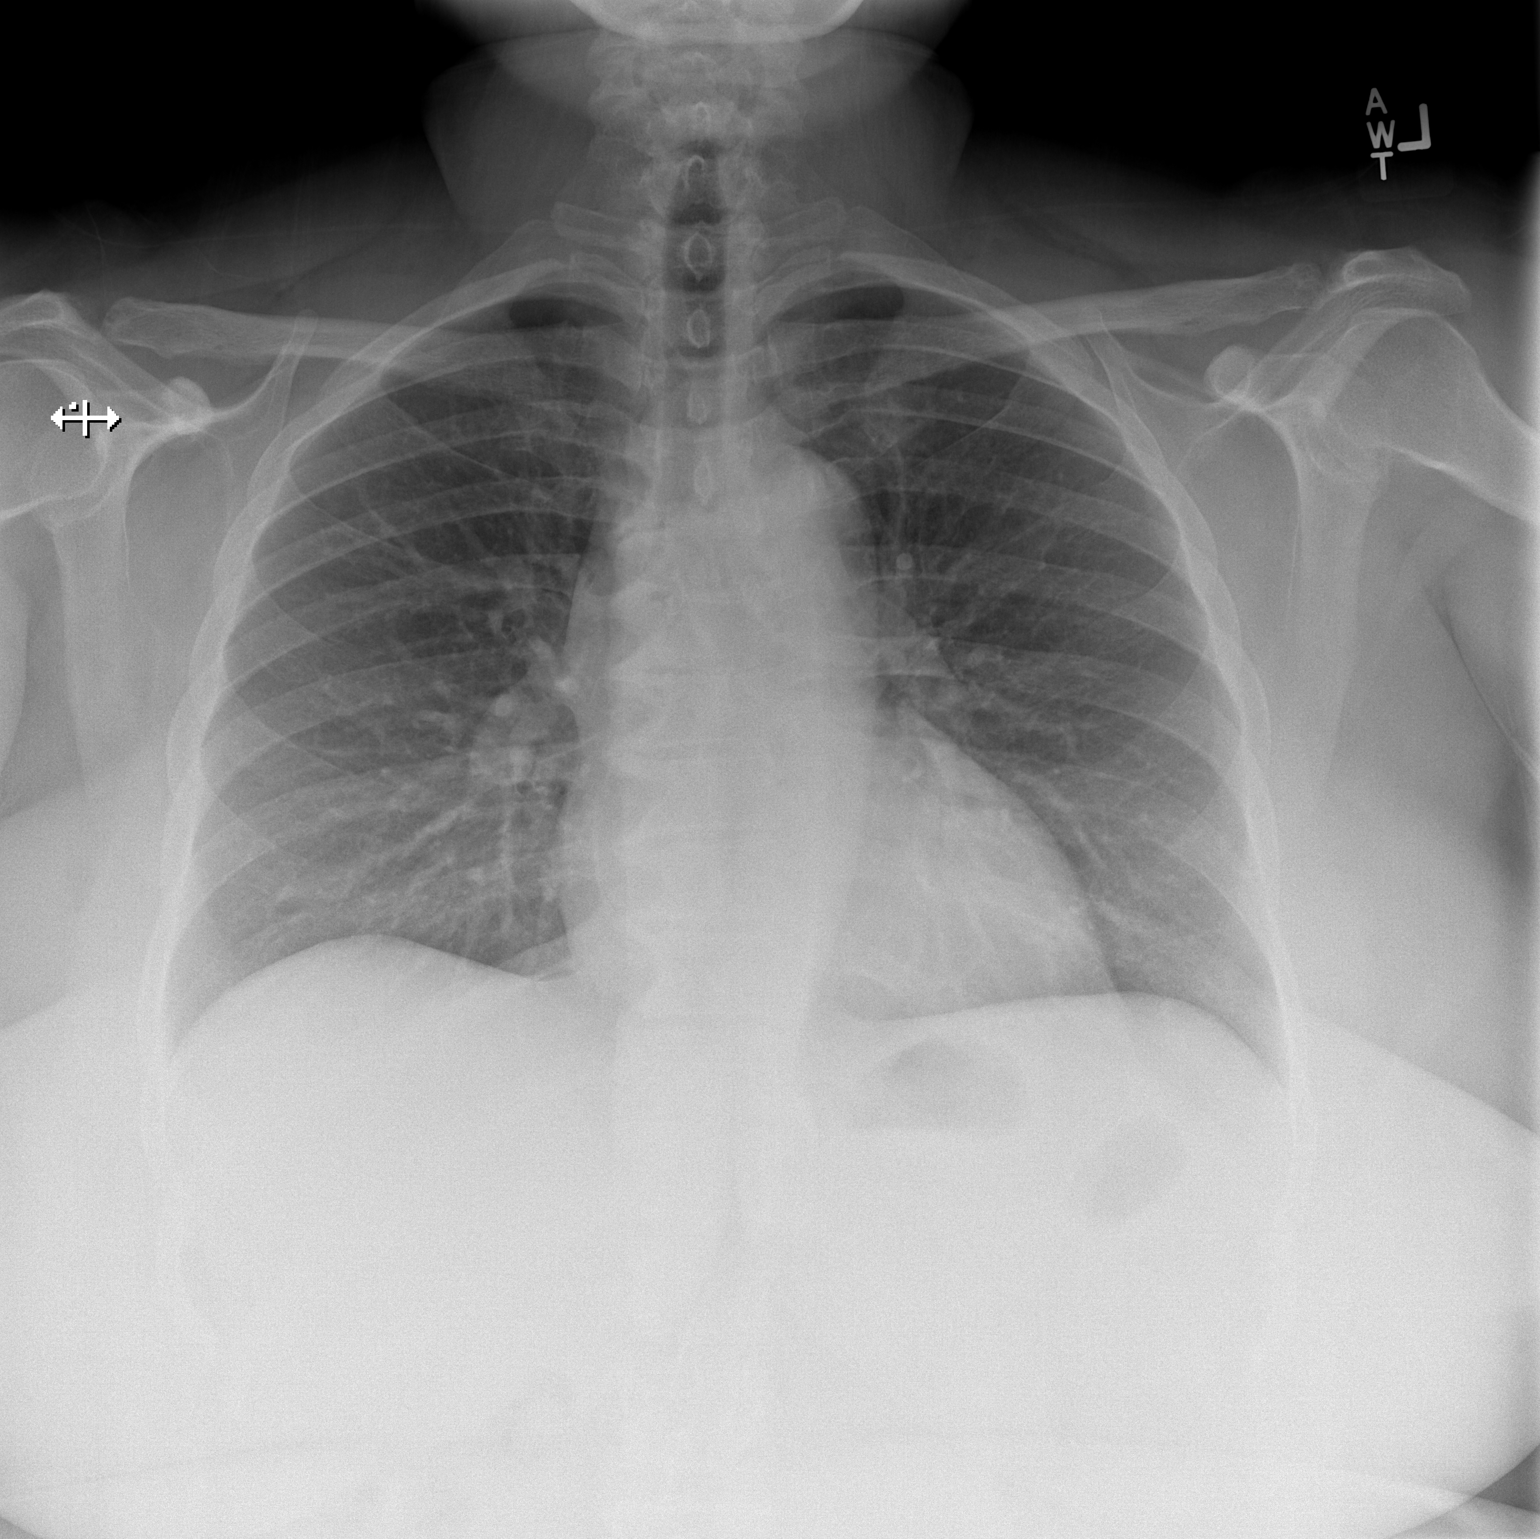

[w chest lat]
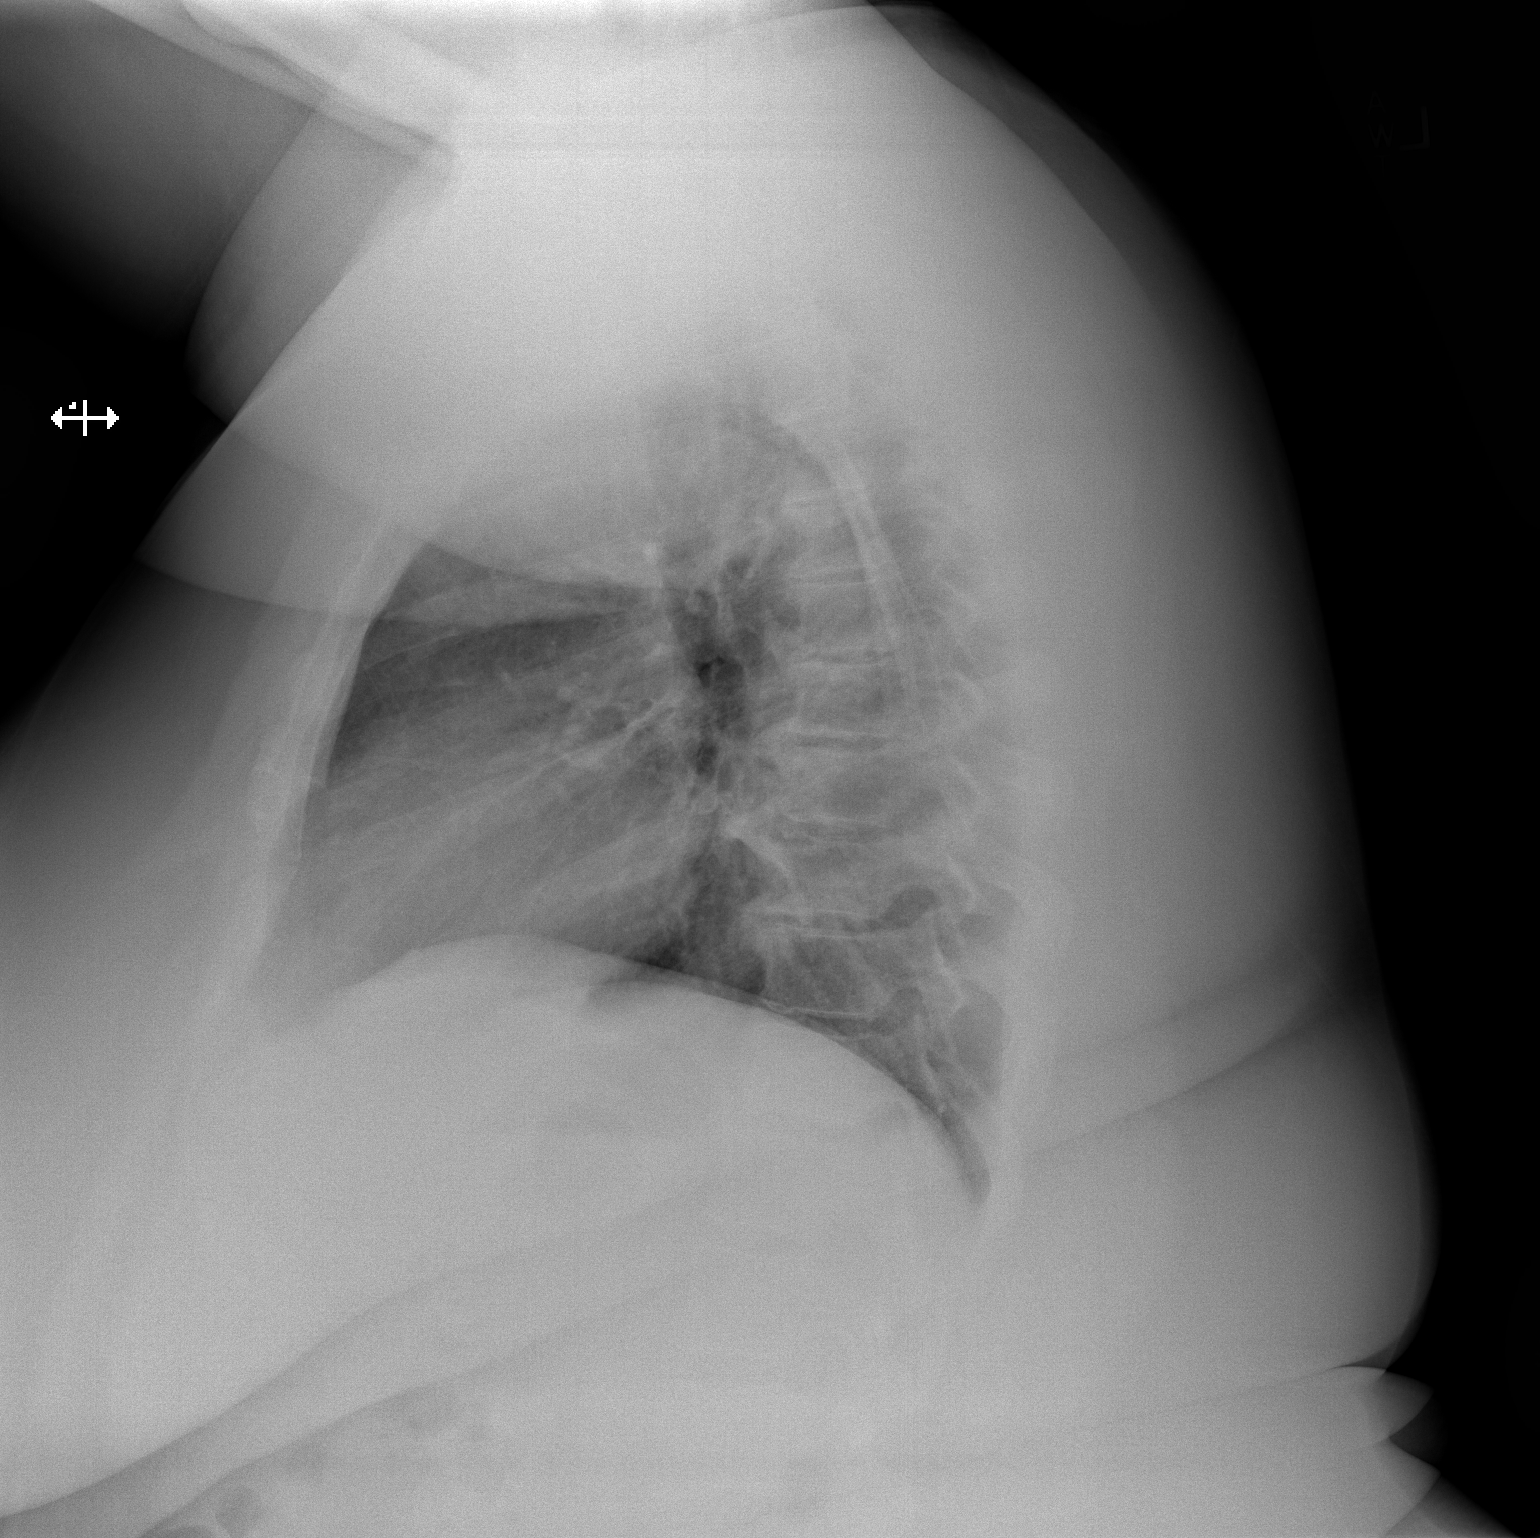

[2 of 2 positions shown; findings below may reference images not displayed]

FINDINGS: Lateral view degraded by patient arm position. Mild thoracic
spondylosis. Midline trachea. Normal heart size and mediastinal
contours. No pleural effusion or pneumothorax. Clear lungs.
IMPRESSION: No acute cardiopulmonary disease.

## 2016-08-01 ENCOUNTER — Ambulatory Visit
Admission: RE | Admit: 2016-08-01 | Discharge: 2016-08-01 | Disposition: A | Discharge status: Home / Self Care | Payer: BLUE CROSS/BLUE SHIELD | Source: Ambulatory Visit | Attending: Family Medicine | Admitting: Family Medicine

## 2016-08-01 DIAGNOSIS — Z1231 Encounter for screening mammogram for malignant neoplasm of breast: Secondary | ICD-10-CM

## 2016-08-22 ENCOUNTER — Telehealth: Payer: Self-pay | Admitting: *Deleted

## 2016-08-22 NOTE — Telephone Encounter (Signed)
Dr Havery Moros,  This pt is scheduled to see you 09-17-16 for a direct colon. She has a BMI of 60.6 with a weight of 310.0. She has a hx of HTN, and osteoarthristis. Do you want her to have an OV or is she okay for a direct at the hospital?  Please advise, thanks, Marijean Niemann

## 2016-08-23 NOTE — Telephone Encounter (Signed)
I would like to see her in the clinic first if you can help coordinate an appointment. Thanks

## 2016-08-23 NOTE — Telephone Encounter (Signed)
Laura Huber was contacted and rescheduled to see Janett Billow in the office on 08/30/16 @ 8:30 am. PV and original colonoscopy were cancelled and will be rescheduled at [facility] at the time of her visit. Thanks.  Riki Sheer, LPN

## 2016-08-30 ENCOUNTER — Ambulatory Visit (INDEPENDENT_AMBULATORY_CARE_PROVIDER_SITE_OTHER): Payer: BLUE CROSS/BLUE SHIELD | Admitting: Gastroenterology

## 2016-08-30 ENCOUNTER — Encounter: Payer: Self-pay | Admitting: Gastroenterology

## 2016-08-30 VITALS — BP 128/80 | HR 80 | Ht 59.84 in | Wt 303.4 lb

## 2016-08-30 DIAGNOSIS — K625 Hemorrhage of anus and rectum: Secondary | ICD-10-CM | POA: Diagnosis not present

## 2016-08-30 DIAGNOSIS — Z1211 Encounter for screening for malignant neoplasm of colon: Secondary | ICD-10-CM | POA: Diagnosis not present

## 2016-08-30 MED ORDER — NA SULFATE-K SULFATE-MG SULF 17.5-3.13-1.6 GM/177ML PO SOLN: 1.0000 | ORAL | 0 refills | Status: DC

## 2016-08-30 MED ORDER — PANTOPRAZOLE SODIUM 40 MG PO TBEC: 40.0000 mg | DELAYED_RELEASE_TABLET | Freq: Every day | ORAL | 11 refills | Status: DC

## 2016-08-30 NOTE — Progress Notes (Signed)
     08/30/2016 Laura Huber [id number] 06-16-1965   HISTORY OF PRESENT ILLNESS:  This is a pleasant 51 year old female. She was put on my schedule today to discuss screening colonoscopy. This needs to be done at [facility] because her BMI is greater than 50. She's never had colonoscopy in the past. She does describe some intermittent small amounts of bright red blood on the toilet paper with bowel movements, which has been occurring on and off for years. Otherwise she is not having issues moving her bowels, abdominal pain, etc.   Past Medical History:  Diagnosis Date  . Arthritis   . Back pain   . Chronic venous insufficiency   . Hypertension    Past Surgical History:  Procedure Laterality Date  . ABDOMINAL HYSTERECTOMY    . APPENDECTOMY      reports that she has never smoked. She has never used smokeless tobacco. She reports that she does not drink alcohol or use drugs. family history includes Clotting disorder in her sister; Colon cancer in her other; Diabetes in her brother, maternal grandmother, maternal uncle, paternal grandmother, and paternal uncle; Heart disease in her maternal aunt and maternal grandmother; Hypertension in her father and mother; Ovarian cancer in her maternal aunt; Prostate cancer in her maternal uncle and paternal uncle. No Known Allergies    Outpatient Encounter Prescriptions as of 08/30/2016  Medication Sig  . cyclobenzaprine (FLEXERIL) 10 MG tablet Take 1 tablet (10 mg total) by mouth 3 (three) times daily as needed for muscle spasms.  . Ibuprofen 200 MG CAPS Take 400 mg by mouth 2 (two) times daily.  . methocarbamol (ROBAXIN) 500 MG tablet Take 1 tablet (500 mg total) by mouth 2 (two) times daily.  . traMADol (ULTRAM) 50 MG tablet Take 1 tablet (50 mg total) by mouth every 12 (twelve) hours as needed for severe pain.  . traMADol (ULTRAM) 50 MG tablet Take 1 tablet (50 mg total) by mouth every 6 (six) hours as needed.  Marland Kitchen TRAMADOL HCL PO  Take 50 mg by mouth 2 (two) times daily. Reported on 04/08/2015   No facility-administered encounter medications on file as of 08/30/2016.      REVIEW OF SYSTEMS  : All other systems reviewed and negative except where noted in the History of Present Illness.   PHYSICAL EXAM: BP 128/80   Pulse 80   Ht 4' 11.84" (1.52 m) Comment: w/o shoes  Wt (!) 303 lb 6 oz (137.6 kg)   BMI 59.56 kg/m  General: Well developed white female in no acute distress Head: Normocephalic and atraumatic Eyes:  Sclerae anicteric, conjunctiva pink. Ears: Normal auditory acuity Neck: Supple, no masses.  Lungs: Clear throughout to auscultation; no increased WOB. Heart: Regular rate and rhythm Abdomen: Soft, nontender, non distended. No masses or hepatomegaly noted. Normal bowel sounds Rectal:  Will be done at the time of colonoscopy. Musculoskeletal: Symmetrical with no gross deformities  Skin: No lesions on visible extremities Extremities: No edema  Neurological: Alert oriented x 4, grossly non-focal Psychological:  Alert and cooperative. Normal mood and affect  ASSESSMENT AND PLAN: -Screening colonoscopy:  Needs to be scheduled at [facility] hospital due to BMI being >50.  Will schedule with Dr. Havery Moros. -Rectal bleeding:  Intermittent small amounts of bright red blood on the toilet paper after BM's for quite some time.  Likely outlet bleeding.   CC:  No ref. provider found

## 2016-08-30 NOTE — Progress Notes (Signed)
Agree with assessment and plan as outlined.  

## 2016-08-30 NOTE — Patient Instructions (Signed)
You have been scheduled for a colonoscopy. Please follow written instructions given to you at your visit today.  Please pick up your prep supplies at the pharmacy within the next 1-3 days. If you use inhalers (even only as needed), please bring them with you on the day of your procedure. Your physician has requested that you go to www.startemmi.com and enter the access code given to you at your visit today. This web site gives a general overview about your procedure. However, you should still follow specific instructions given to you by our office regarding your preparation for the procedure.  We have sent the following medications to your pharmacy for you to pick up at your convenience: Pantoprazole 40 mg daily.    

## 2016-09-04 ENCOUNTER — Telehealth: Payer: Self-pay | Admitting: Gastroenterology

## 2016-09-04 NOTE — Telephone Encounter (Signed)
Pharmacy needs prior auth for suprep for colon on 8.31.18 at [facility]. Pt had ov 6.21.18 with Janett Billow.

## 2016-09-04 NOTE — Telephone Encounter (Signed)
Our office does not handle Prior Auths for Suprep.

## 2016-09-17 ENCOUNTER — Encounter: Payer: BLUE CROSS/BLUE SHIELD | Admitting: Gastroenterology

## 2016-10-26 ENCOUNTER — Encounter (HOSPITAL_COMMUNITY): Payer: Self-pay | Admitting: *Deleted

## 2016-11-08 ENCOUNTER — Telehealth: Payer: Self-pay | Admitting: Gastroenterology

## 2016-11-08 NOTE — Telephone Encounter (Signed)
Spoke to pt.  Pharmacy had her prep prescription but it was $116 and she can not afford it. Offered coupon but she was still insistent that she couldn't afford $50. I told her we would leave a Suprep sample at the front desk for her to pick up.  She was very appreciative and expressed understanding that she needs to pick up this afternoon so she can begin tonight for tomorrow's procedure.

## 2016-11-09 ENCOUNTER — Ambulatory Visit (HOSPITAL_COMMUNITY)
Admission: RE | Admit: 2016-11-09 | Discharge: 2016-11-09 | Disposition: A | Discharge status: Home / Self Care | Payer: BLUE CROSS/BLUE SHIELD | Source: Ambulatory Visit | Attending: Gastroenterology | Admitting: Gastroenterology

## 2016-11-09 ENCOUNTER — Ambulatory Visit (HOSPITAL_COMMUNITY): Payer: BLUE CROSS/BLUE SHIELD | Admitting: Certified Registered Nurse Anesthetist

## 2016-11-09 ENCOUNTER — Encounter (HOSPITAL_COMMUNITY)
Admission: RE | Disposition: A | Discharge status: Home / Self Care | Payer: Self-pay | Source: Ambulatory Visit | Attending: Gastroenterology

## 2016-11-09 ENCOUNTER — Encounter (HOSPITAL_COMMUNITY): Payer: Self-pay | Admitting: Certified Registered Nurse Anesthetist

## 2016-11-09 DIAGNOSIS — Z1211 Encounter for screening for malignant neoplasm of colon: Secondary | ICD-10-CM

## 2016-11-09 DIAGNOSIS — K6289 Other specified diseases of anus and rectum: Secondary | ICD-10-CM | POA: Diagnosis not present

## 2016-11-09 DIAGNOSIS — I872 Venous insufficiency (chronic) (peripheral): Secondary | ICD-10-CM | POA: Diagnosis not present

## 2016-11-09 DIAGNOSIS — I10 Essential (primary) hypertension: Secondary | ICD-10-CM | POA: Insufficient documentation

## 2016-11-09 DIAGNOSIS — K625 Hemorrhage of anus and rectum: Secondary | ICD-10-CM

## 2016-11-09 DIAGNOSIS — K648 Other hemorrhoids: Secondary | ICD-10-CM | POA: Insufficient documentation

## 2016-11-09 DIAGNOSIS — Z6841 Body Mass Index (BMI) 40.0 and over, adult: Secondary | ICD-10-CM | POA: Insufficient documentation

## 2016-11-09 DIAGNOSIS — K921 Melena: Secondary | ICD-10-CM | POA: Diagnosis not present

## 2016-11-09 DIAGNOSIS — D120 Benign neoplasm of cecum: Secondary | ICD-10-CM | POA: Insufficient documentation

## 2016-11-09 HISTORY — DX: Gastro-esophageal reflux disease without esophagitis: K21.9

## 2016-11-09 HISTORY — PX: COLONOSCOPY WITH PROPOFOL: SHX5780

## 2016-11-09 SURGERY — COLONOSCOPY WITH PROPOFOL
Anesthesia: Monitor Anesthesia Care

## 2016-11-09 MED ORDER — SODIUM CHLORIDE 0.9 % IV SOLN: INTRAVENOUS | Status: DC

## 2016-11-09 MED ORDER — PROPOFOL 10 MG/ML IV BOLUS
INTRAVENOUS | Status: AC
  Filled 2016-11-09: qty 40

## 2016-11-09 MED ORDER — LACTATED RINGERS IV SOLN
INTRAVENOUS | Status: DC
  Administered 2016-11-09: 10:00:00 via INTRAVENOUS

## 2016-11-09 MED ORDER — PROPOFOL 10 MG/ML IV BOLUS
INTRAVENOUS | Status: AC
  Filled 2016-11-09: qty 20

## 2016-11-09 MED ORDER — PROPOFOL 500 MG/50ML IV EMUL
INTRAVENOUS | Status: DC | PRN
  Administered 2016-11-09: 150 ug/kg/min via INTRAVENOUS

## 2016-11-09 SURGICAL SUPPLY — 21 items

## 2016-11-09 NOTE — Anesthesia Procedure Notes (Signed)
Procedure Name: Intubation Date/Time: 11/09/2016 10:53 AM Performed by: Lissa Morales Pre-anesthesia Checklist: Patient identified, Emergency Drugs available, Suction available and Patient being monitored Patient Re-evaluated:Patient Re-evaluated prior to induction Oxygen Delivery Method: Simple face mask Preoxygenation: Pre-oxygenation with 100% oxygen Airway Equipment and Method: Oral airway Placement Confirmation: positive ETCO2 Tube secured with: Tape Dental Injury: Teeth and Oropharynx as per pre-operative assessment

## 2016-11-09 NOTE — Transfer of Care (Signed)
Immediate Anesthesia Transfer of Care Note  Patient: Laura Huber  Procedure(s) Performed: Procedure(s): COLONOSCOPY WITH PROPOFOL (N/A)  Patient Location: PACU  Anesthesia Type:MAC  Level of Consciousness: Patient easily awoken, sedated, comfortable, cooperative, following commands, responds to stimulation.   Airway & Oxygen Therapy: Patient spontaneously breathing, ventilating well, oxygen via simple oxygen mask.  Post-op Assessment: Report given to PACU RN, vital signs reviewed and stable, moving all extremities.   Post vital signs: Reviewed and stable.  Complications: No apparent anesthesia complications  Last Vitals:  Vitals:   11/09/16 0952  BP: (!) 137/94  Resp: 18  Temp: 36.5 C  SpO2: 99%    Last Pain:  Vitals:   11/09/16 0952  TempSrc: Oral         Complications: No apparent anesthesia complications

## 2016-11-09 NOTE — Anesthesia Preprocedure Evaluation (Signed)
Anesthesia Evaluation  Patient identified by MRN, date of birth, ID band Patient awake    Reviewed: Allergy & Precautions, NPO status , Patient's Chart, lab work & pertinent test results  History of Anesthesia Complications Negative for: history of anesthetic complications  Airway Mallampati: I  TM Distance: >3 FB Neck ROM: Full    Dental  (+) Missing, Teeth Intact,    Pulmonary neg pulmonary ROS,    breath sounds clear to auscultation       Cardiovascular hypertension, Pt. on medications (-) angina(-) Past MI and (-) CHF  Rhythm:Regular     Neuro/Psych negative neurological ROS  negative psych ROS   GI/Hepatic Neg liver ROS, GERD  Medicated and Controlled,  Endo/Other  Morbid obesity  Renal/GU negative Renal ROS     Musculoskeletal  (+) Arthritis ,   Abdominal   Peds  Hematology negative hematology ROS (+)   Anesthesia Other Findings   Reproductive/Obstetrics                             Anesthesia Physical Anesthesia Plan  ASA: III  Anesthesia Plan: MAC   Post-op Pain Management:    Induction: Intravenous  PONV Risk Score and Plan: 2 and Treatment may vary due to age or medical condition  Airway Management Planned: Nasal Cannula  Additional Equipment: None  Intra-op Plan:   Post-operative Plan:   Informed Consent: I have reviewed the patients History and Physical, chart, labs and discussed the procedure including the risks, benefits and alternatives for the proposed anesthesia with the patient or authorized representative who has indicated his/her understanding and acceptance.   Dental advisory given  Plan Discussed with: CRNA and Surgeon  Anesthesia Plan Comments:         Anesthesia Quick Evaluation

## 2016-11-09 NOTE — Discharge Instructions (Signed)

## 2016-11-09 NOTE — Interval H&P Note (Signed)
History and Physical Interval Note:  11/09/2016 10:48 AM  Laura Huber  has presented today for surgery, with the diagnosis of screening colon, rectal bleeding, BMI  more than 50  The various methods of treatment have been discussed with the patient and family. After consideration of risks, benefits and other options for treatment, the patient has consented to  Procedure(s): COLONOSCOPY WITH PROPOFOL (N/A) as a surgical intervention .  The patient's history has been reviewed, patient examined, no change in status, stable for surgery.  I have reviewed the patient's chart and labs.  Questions were answered to the patient's satisfaction.     [place]

## 2016-11-09 NOTE — Op Note (Signed)
[facility] Patient Name: Laura Huber Procedure Date: 11/09/2016 MRN: [id number] Attending MD: Carlota Raspberry. Dorrien Grunder MD, MD Date of Birth: 1965-11-28 CSN: [id number] Age: 51 Admit Type: Outpatient Procedure:                Colonoscopy Indications:              This is the patient's first colonoscopy,                            Hematochezia Providers:                Remo Lipps P. Fredrick Geoghegan MD, MD, Laverta Baltimore RN,                            RN, Tinnie Gens, Technician Referring MD:              Medicines:                Monitored Anesthesia Care Complications:            No immediate complications. Estimated blood loss:                            Minimal. Estimated Blood Loss:     Estimated blood loss was minimal. Procedure:                Pre-Anesthesia Assessment:                           - Prior to the procedure, a History and Physical                            was performed, and patient medications and                            allergies were reviewed. The patient's tolerance of                            previous anesthesia was also reviewed. The risks                            and benefits of the procedure and the sedation                            options and risks were discussed with the patient.                            All questions were answered, and informed consent                            was obtained. Prior Anticoagulants: The patient has                            taken no previous anticoagulant or antiplatelet                            agents. ASA Grade Assessment: III -  A patient with                            severe systemic disease. After reviewing the risks                            and benefits, the patient was deemed in                            satisfactory condition to undergo the procedure.                           After obtaining informed consent, the colonoscope                            was passed under direct vision.  Throughout the                            procedure, the patient's blood pressure, pulse, and                            oxygen saturations were monitored continuously. The                            EC-[id number] ([id number]) scope was introduced through                            the anus and advanced to the the terminal ileum,                            with identification of the appendiceal orifice and                            IC valve. The colonoscopy was performed without                            difficulty. The patient tolerated the procedure                            well. The quality of the bowel preparation was                            good. The terminal ileum, ileocecal valve,                            appendiceal orifice, and rectum were photographed. Scope In: 11:02:12 AM Scope Out: 11:19:03 AM Scope Withdrawal Time: 0 hours 12 minutes 53 seconds  Total Procedure Duration: 0 hours 16 minutes 51 seconds  Findings:      The perianal and digital rectal examinations were normal.      The terminal ileum appeared normal.      A 3 mm polyp was found in the ileocecal valve. The polyp was sessile.       The polyp was removed with a cold snare. Resection and retrieval were  complete.      Internal hemorrhoids were found during retroflexion.      Anal papilla(e) were hypertrophied.      The exam was otherwise without abnormality. Impression:               - The examined portion of the ileum was normal.                           - One 3 mm polyp at the ileocecal valve, removed                            with a cold snare. Resected and retrieved.                           - Internal hemorrhoids.                           - Anal papilla(e) were hypertrophied.                           - The examination was otherwise normal.                           Suspect bleeding symptoms are due to hemorrhoids. Moderate Sedation:      No moderate sedation, case performed with  MAC Recommendation:           - Patient has a contact number available for                            emergencies. The signs and symptoms of potential                            delayed complications were discussed with the                            patient. Return to normal activities tomorrow.                            Written discharge instructions were provided to the                            patient.                           - Resume previous diet.                           - Continue present medications.                           - Daily fiber supplement for treatment of                            hemorrhoids. If bleeding persists contact our  office to discuss hemorrhoid banding                           - Await pathology results.                           - Repeat colonoscopy is recommended for                            surveillance. The colonoscopy date will be                            determined after pathology results from today's                            exam become available for review. Procedure Code(s):        --- Professional ---                           [phone], Colonoscopy, flexible; with removal of                            tumor(s), polyp(s), or other lesion(s) by snare                            technique Diagnosis Code(s):        --- Professional ---                           K64.8, Other hemorrhoids                           D12.0, Benign neoplasm of cecum                           K62.89, Other specified diseases of anus and rectum                           K92.1, Melena (includes Hematochezia) CPT copyright 2016 American Medical Association. All rights reserved. The codes documented in this report are preliminary and upon coder review may  be revised to meet current compliance requirements. Remo Lipps P. Ivelis Norgard MD, MD 11/09/2016 11:25:15 AM This report has been signed electronically. Number of Addenda: 0

## 2016-11-09 NOTE — H&P (Signed)
   HPI :  51 y/o female here for first colonoscopy screening. She endorses periodic rectal bleeding. History of obesity with BMI > 50, case being done at the hospital.  Past Medical History:  Diagnosis Date  . Arthritis   . Back pain   . Chronic venous insufficiency   . GERD (gastroesophageal reflux disease)   . Hypertension      Past Surgical History:  Procedure Laterality Date  . ABDOMINAL HYSTERECTOMY    . APPENDECTOMY     Family History  Problem Relation Age of Onset  . Hypertension Mother   . Hypertension Father   . Clotting disorder Sister   . Diabetes Brother   . Diabetes Maternal Grandmother   . Heart disease Maternal Grandmother   . Diabetes Paternal Grandmother   . Ovarian cancer Maternal Aunt   . Prostate cancer Maternal Uncle   . Diabetes Maternal Uncle   . Prostate cancer Paternal Uncle   . Diabetes Paternal Uncle   . Colon cancer Other        maternal great grandmother  . Heart disease Maternal Aunt    Social History  Substance Use Topics  . Smoking status: Never Smoker  . Smokeless tobacco: Never Used  . Alcohol use No   Current Facility-Administered Medications  Medication Dose Route Frequency Provider Last Rate Last Dose  . lactated ringers infusion   Intravenous Continuous Lui Bellis, Carlota Raspberry, MD 10 mL/hr at 11/09/16 1007     No Known Allergies   Review of Systems: All systems reviewed and negative except where noted in HPI.    No results found.  Physical Exam: BP (!) 137/94   Temp 97.7 F (36.5 C) (Oral)   Resp 18   Ht 4\' 9"  (1.448 m)   Wt 300 lb (136.1 kg)   SpO2 99%   BMI 64.92 kg/m  Constitutional: Pleasant,well-developed, female in no acute distress.  Cardiovascular: Normal rate, regular rhythm.  Pulmonary/chest: Effort normal and breath sounds normal. No wheezing, rales or rhonchi. Abdominal: Soft, protuberant, nontender. There are no masses palpable. No hepatomegaly. Extremities: no edema  ASSESSMENT AND PLAN: 51 y/o  female with morbid obesity, BMI > 50, here for first time colon cancer screening, who also endorses rectal bleeding. Colonoscopy to further evaluate. Following discussion of risks / benefits she wishes to proceed.   Shawnee Cellar, MD [facility] Pager [phone]

## 2016-11-13 ENCOUNTER — Encounter (HOSPITAL_COMMUNITY): Payer: Self-pay | Admitting: Gastroenterology

## 2016-11-13 NOTE — Anesthesia Postprocedure Evaluation (Addendum)
Anesthesia Post Note  Patient: Multimedia programmer  Procedure(s) Performed: Procedure(s) (LRB): COLONOSCOPY WITH PROPOFOL (N/A)     Patient location during evaluation: Endoscopy Anesthesia Type: MAC Level of consciousness: awake and alert Pain management: pain level controlled Vital Signs Assessment: post-procedure vital signs reviewed and stable Respiratory status: spontaneous breathing, nonlabored ventilation, respiratory function stable and patient connected to nasal cannula oxygen Cardiovascular status: stable and blood pressure returned to baseline Postop Assessment: no signs of nausea or vomiting Anesthetic complications: no    Last Vitals:  Vitals:   11/09/16 1135 11/09/16 1140  BP: 117/72 130/80  Pulse: 76 78  Resp: 18 (!) 21  Temp:    SpO2: 99% 100%    Last Pain:  Vitals:   11/09/16 0952  TempSrc: Oral                 Denesia Donelan

## 2016-11-14 ENCOUNTER — Encounter: Payer: Self-pay | Admitting: Gastroenterology

## 2016-11-16 ENCOUNTER — Telehealth: Payer: Self-pay | Admitting: Gastroenterology

## 2016-11-16 NOTE — Telephone Encounter (Signed)
Patient already had procedure. 11/09/16

## 2017-07-25 ENCOUNTER — Other Ambulatory Visit: Payer: Self-pay | Admitting: Family Medicine

## 2017-07-25 DIAGNOSIS — Z1231 Encounter for screening mammogram for malignant neoplasm of breast: Secondary | ICD-10-CM

## 2017-08-16 ENCOUNTER — Ambulatory Visit: Payer: BLUE CROSS/BLUE SHIELD

## 2017-09-05 ENCOUNTER — Ambulatory Visit
Admission: RE | Admit: 2017-09-05 | Discharge: 2017-09-05 | Disposition: A | Discharge status: Home / Self Care | Payer: BLUE CROSS/BLUE SHIELD | Source: Ambulatory Visit | Attending: Family Medicine | Admitting: Family Medicine

## 2017-09-05 DIAGNOSIS — Z1231 Encounter for screening mammogram for malignant neoplasm of breast: Secondary | ICD-10-CM

## 2017-10-28 ENCOUNTER — Other Ambulatory Visit: Payer: Self-pay | Admitting: Gastroenterology

## 2017-12-01 ENCOUNTER — Other Ambulatory Visit: Payer: Self-pay | Admitting: Gastroenterology

## 2019-01-04 IMAGING — MG DIGITAL SCREENING BILATERAL MAMMOGRAM WITH CAD
7 series · 7 of 7 positions shown · non-contrast
Comparison: Previous exam(s).

CLINICAL DATA: Screening.

EXAM:
DIGITAL SCREENING BILATERAL MAMMOGRAM WITH CAD

[R MLO (1 of 2)]
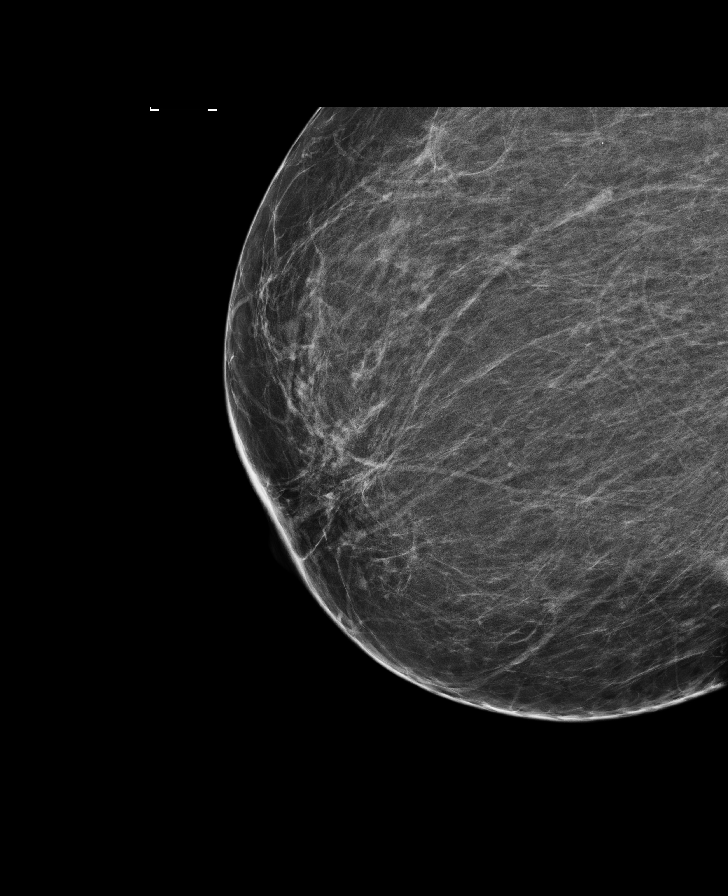

[R MLO (2 of 2)]
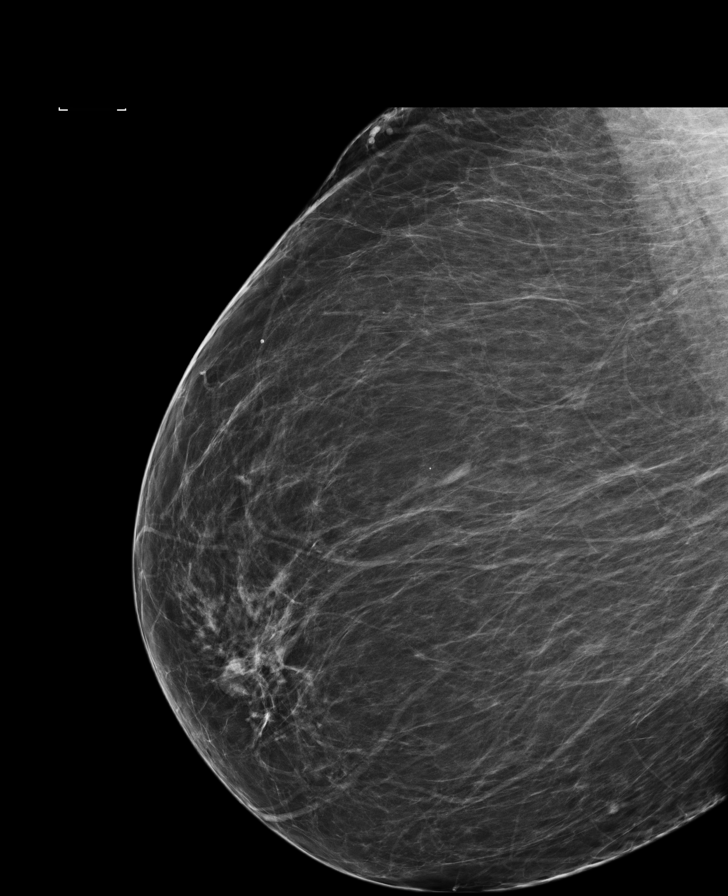

[L CC]
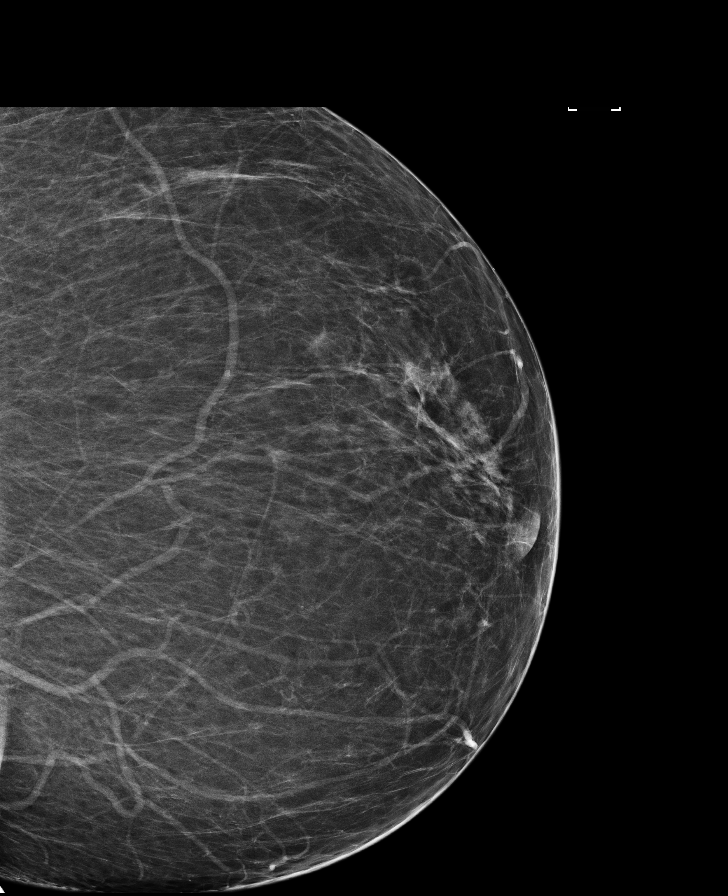

[R CC]
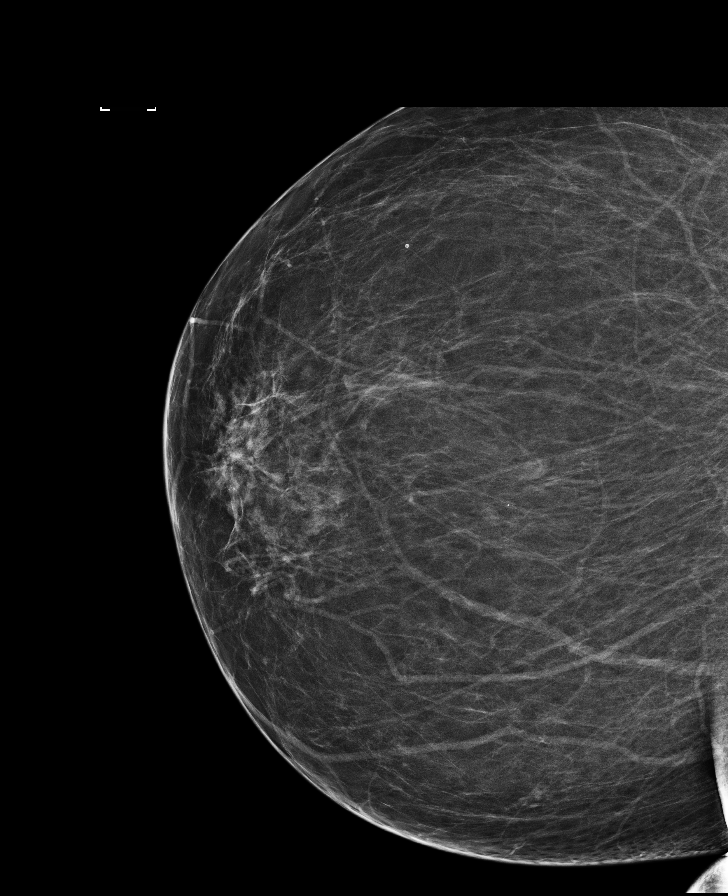

[L MLO (1 of 2)]
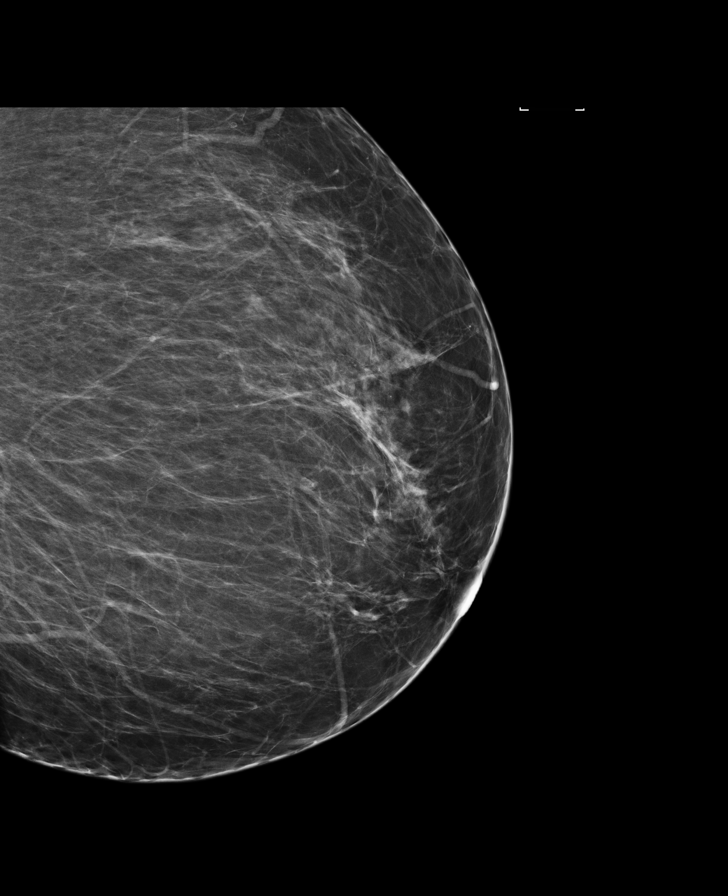

[L CV]
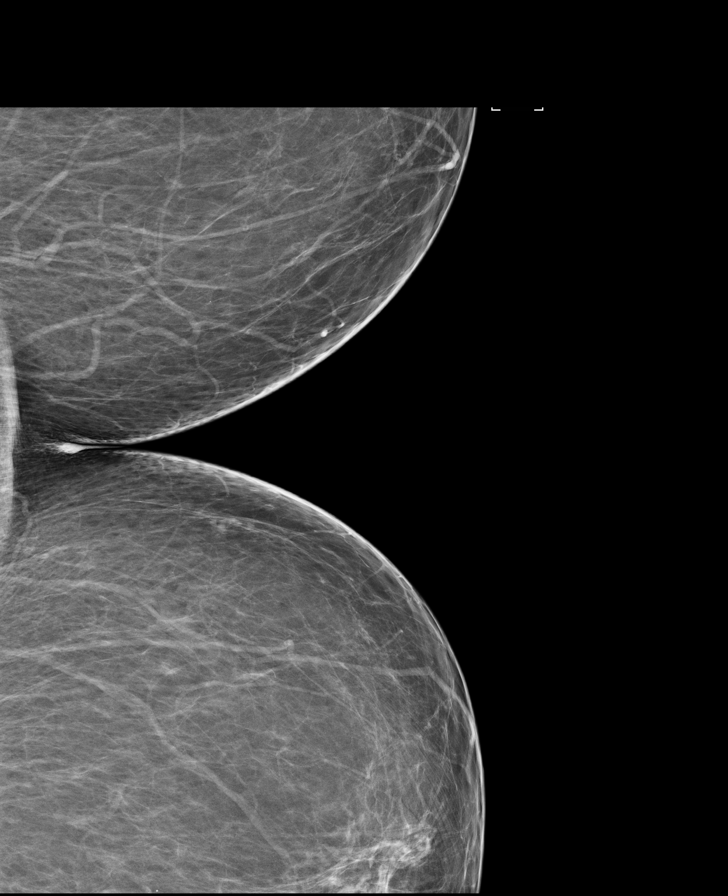

[L MLO (2 of 2)]
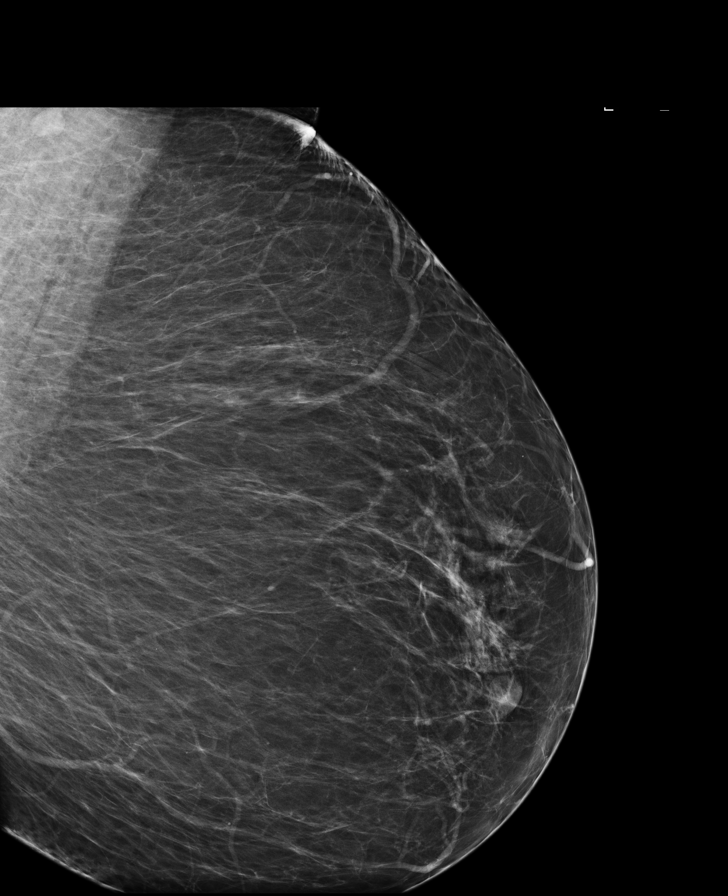

[7 of 7 positions shown; findings below may reference images not displayed]

ACR Breast Density Category b: There are scattered areas of
fibroglandular density.
FINDINGS: There are no findings suspicious for malignancy. Images were
processed with CAD.
IMPRESSION: No mammographic evidence of malignancy. A result letter of this
screening mammogram will be mailed directly to the patient.

RECOMMENDATION:
Screening mammogram in one year. (Code:AS-G-LCT)

BI-RADS CATEGORY  1: Negative.

## 2023-10-03 ENCOUNTER — Other Ambulatory Visit: Payer: Self-pay
